# Patient Record
Sex: Female | Born: 1935 | Race: White | Hispanic: No | Smoking: Never smoker
Health system: Southern US, Community
[De-identification: ages and names within clinical notes are randomized; demographics above are authoritative.]

## PROBLEM LIST (undated history)

## (undated) DIAGNOSIS — I10 Essential (primary) hypertension: Secondary | ICD-10-CM

## (undated) DIAGNOSIS — C541 Malignant neoplasm of endometrium: Secondary | ICD-10-CM

## (undated) DIAGNOSIS — I4891 Unspecified atrial fibrillation: Secondary | ICD-10-CM

## (undated) DIAGNOSIS — I499 Cardiac arrhythmia, unspecified: Secondary | ICD-10-CM

## (undated) DIAGNOSIS — C189 Malignant neoplasm of colon, unspecified: Secondary | ICD-10-CM

## (undated) DIAGNOSIS — C801 Malignant (primary) neoplasm, unspecified: Secondary | ICD-10-CM

## (undated) HISTORY — PX: OTHER SURGICAL HISTORY: SHX169

## (undated) HISTORY — DX: Cardiac arrhythmia, unspecified: I49.9

## (undated) HISTORY — PX: COLECTOMY: SHX59

## (undated) HISTORY — DX: Malignant neoplasm of colon, unspecified: C18.9

## (undated) HISTORY — PX: BOWEL RESECTION: SHX1257

## (undated) HISTORY — DX: Malignant (primary) neoplasm, unspecified: C80.1

## (undated) HISTORY — DX: Essential (primary) hypertension: I10

## (undated) HISTORY — PX: APPENDECTOMY: SHX54

---

## 1998-02-25 ENCOUNTER — Other Ambulatory Visit: Admission: RE | Admit: 1998-02-25 | Discharge: 1998-02-25 | Payer: Self-pay | Admitting: Family Medicine

## 1999-05-11 ENCOUNTER — Other Ambulatory Visit: Admission: RE | Admit: 1999-05-11 | Discharge: 1999-05-11 | Payer: Self-pay | Admitting: Family Medicine

## 1999-10-19 ENCOUNTER — Encounter: Payer: Self-pay | Admitting: Gastroenterology

## 1999-10-19 ENCOUNTER — Ambulatory Visit (HOSPITAL_COMMUNITY): Admission: RE | Admit: 1999-10-19 | Discharge: 1999-10-19 | Payer: Self-pay | Admitting: Gastroenterology

## 2000-01-18 ENCOUNTER — Ambulatory Visit (HOSPITAL_COMMUNITY): Admission: RE | Admit: 2000-01-18 | Discharge: 2000-01-18 | Payer: Self-pay | Admitting: Gastroenterology

## 2000-01-18 ENCOUNTER — Encounter (INDEPENDENT_AMBULATORY_CARE_PROVIDER_SITE_OTHER): Payer: Self-pay

## 2000-01-26 ENCOUNTER — Encounter: Payer: Self-pay | Admitting: General Surgery

## 2000-01-28 ENCOUNTER — Encounter (INDEPENDENT_AMBULATORY_CARE_PROVIDER_SITE_OTHER): Payer: Self-pay | Admitting: Specialist

## 2000-01-28 ENCOUNTER — Inpatient Hospital Stay (HOSPITAL_COMMUNITY): Admission: RE | Admit: 2000-01-28 | Discharge: 2000-02-05 | Payer: Self-pay | Admitting: General Surgery

## 2000-04-04 ENCOUNTER — Encounter: Payer: Self-pay | Admitting: General Surgery

## 2000-04-04 ENCOUNTER — Ambulatory Visit (HOSPITAL_BASED_OUTPATIENT_CLINIC_OR_DEPARTMENT_OTHER): Admission: RE | Admit: 2000-04-04 | Discharge: 2000-04-04 | Payer: Self-pay | Admitting: General Surgery

## 2000-04-05 ENCOUNTER — Encounter: Payer: Self-pay | Admitting: Oncology

## 2000-04-05 ENCOUNTER — Encounter: Admission: RE | Admit: 2000-04-05 | Discharge: 2000-04-05 | Payer: Self-pay | Admitting: Oncology

## 2000-04-07 ENCOUNTER — Encounter: Admission: RE | Admit: 2000-04-07 | Discharge: 2000-07-06 | Payer: Self-pay | Admitting: *Deleted

## 2000-04-10 ENCOUNTER — Ambulatory Visit (HOSPITAL_COMMUNITY): Admission: RE | Admit: 2000-04-10 | Discharge: 2000-04-10 | Payer: Self-pay | Admitting: Oncology

## 2000-04-11 ENCOUNTER — Encounter: Payer: Self-pay | Admitting: Oncology

## 2000-04-11 ENCOUNTER — Ambulatory Visit (HOSPITAL_COMMUNITY): Admission: RE | Admit: 2000-04-11 | Discharge: 2000-04-11 | Payer: Self-pay | Admitting: Oncology

## 2000-05-11 ENCOUNTER — Other Ambulatory Visit: Admission: RE | Admit: 2000-05-11 | Discharge: 2000-05-11 | Payer: Self-pay | Admitting: *Deleted

## 2000-05-17 ENCOUNTER — Encounter (INDEPENDENT_AMBULATORY_CARE_PROVIDER_SITE_OTHER): Payer: Self-pay | Admitting: Specialist

## 2000-05-17 ENCOUNTER — Inpatient Hospital Stay (HOSPITAL_COMMUNITY): Admission: AD | Admit: 2000-05-17 | Discharge: 2000-06-27 | Payer: Self-pay | Admitting: Oncology

## 2000-05-17 ENCOUNTER — Encounter: Payer: Self-pay | Admitting: Oncology

## 2000-05-19 ENCOUNTER — Encounter: Payer: Self-pay | Admitting: Oncology

## 2000-05-20 ENCOUNTER — Encounter: Payer: Self-pay | Admitting: Oncology

## 2000-05-21 ENCOUNTER — Encounter: Payer: Self-pay | Admitting: Oncology

## 2000-05-22 ENCOUNTER — Encounter: Payer: Self-pay | Admitting: Surgery

## 2000-05-22 ENCOUNTER — Encounter: Payer: Self-pay | Admitting: Oncology

## 2000-05-30 ENCOUNTER — Encounter: Payer: Self-pay | Admitting: Oncology

## 2000-06-01 ENCOUNTER — Encounter: Payer: Self-pay | Admitting: Gastroenterology

## 2000-06-13 ENCOUNTER — Encounter: Payer: Self-pay | Admitting: Surgery

## 2000-06-15 ENCOUNTER — Encounter: Payer: Self-pay | Admitting: Surgery

## 2000-06-16 ENCOUNTER — Encounter: Payer: Self-pay | Admitting: Oncology

## 2000-06-17 ENCOUNTER — Encounter: Payer: Self-pay | Admitting: Surgery

## 2000-06-20 ENCOUNTER — Encounter (HOSPITAL_COMMUNITY): Payer: Self-pay | Admitting: Oncology

## 2000-10-06 ENCOUNTER — Encounter: Payer: Self-pay | Admitting: Oncology

## 2000-10-06 ENCOUNTER — Ambulatory Visit (HOSPITAL_COMMUNITY): Admission: RE | Admit: 2000-10-06 | Discharge: 2000-10-06 | Payer: Self-pay | Admitting: Oncology

## 2000-12-31 ENCOUNTER — Ambulatory Visit (HOSPITAL_COMMUNITY): Admission: RE | Admit: 2000-12-31 | Discharge: 2000-12-31 | Payer: Self-pay | Admitting: Family Medicine

## 2000-12-31 ENCOUNTER — Encounter: Payer: Self-pay | Admitting: Family Medicine

## 2001-02-07 ENCOUNTER — Ambulatory Visit (HOSPITAL_COMMUNITY): Admission: RE | Admit: 2001-02-07 | Discharge: 2001-02-07 | Payer: Self-pay | Admitting: Gastroenterology

## 2001-02-07 ENCOUNTER — Encounter (INDEPENDENT_AMBULATORY_CARE_PROVIDER_SITE_OTHER): Payer: Self-pay

## 2001-09-27 ENCOUNTER — Encounter: Payer: Self-pay | Admitting: Oncology

## 2001-09-27 ENCOUNTER — Ambulatory Visit (HOSPITAL_COMMUNITY): Admission: RE | Admit: 2001-09-27 | Discharge: 2001-09-27 | Payer: Self-pay | Admitting: Oncology

## 2002-01-24 ENCOUNTER — Encounter: Payer: Self-pay | Admitting: Oncology

## 2002-01-24 ENCOUNTER — Ambulatory Visit (HOSPITAL_COMMUNITY): Admission: RE | Admit: 2002-01-24 | Discharge: 2002-01-24 | Payer: Self-pay | Admitting: Oncology

## 2002-07-11 ENCOUNTER — Other Ambulatory Visit: Admission: RE | Admit: 2002-07-11 | Discharge: 2002-07-11 | Payer: Self-pay | Admitting: Family Medicine

## 2002-08-03 ENCOUNTER — Encounter: Payer: Self-pay | Admitting: Oncology

## 2002-08-03 ENCOUNTER — Ambulatory Visit (HOSPITAL_COMMUNITY): Admission: RE | Admit: 2002-08-03 | Discharge: 2002-08-03 | Payer: Self-pay | Admitting: Oncology

## 2003-01-31 ENCOUNTER — Ambulatory Visit (HOSPITAL_COMMUNITY): Admission: RE | Admit: 2003-01-31 | Discharge: 2003-01-31 | Payer: Self-pay | Admitting: Oncology

## 2003-01-31 ENCOUNTER — Encounter: Payer: Self-pay | Admitting: Oncology

## 2003-08-01 ENCOUNTER — Ambulatory Visit (HOSPITAL_COMMUNITY): Admission: RE | Admit: 2003-08-01 | Discharge: 2003-08-01 | Payer: Self-pay | Admitting: Oncology

## 2004-01-28 ENCOUNTER — Ambulatory Visit (HOSPITAL_COMMUNITY): Admission: RE | Admit: 2004-01-28 | Discharge: 2004-01-28 | Payer: Self-pay | Admitting: Oncology

## 2004-02-28 ENCOUNTER — Ambulatory Visit (HOSPITAL_COMMUNITY): Admission: RE | Admit: 2004-02-28 | Discharge: 2004-02-28 | Payer: Self-pay | Admitting: Gastroenterology

## 2004-02-28 ENCOUNTER — Encounter (INDEPENDENT_AMBULATORY_CARE_PROVIDER_SITE_OTHER): Payer: Self-pay | Admitting: Specialist

## 2004-07-29 ENCOUNTER — Ambulatory Visit: Payer: Self-pay | Admitting: Oncology

## 2004-08-03 ENCOUNTER — Ambulatory Visit (HOSPITAL_COMMUNITY): Admission: RE | Admit: 2004-08-03 | Discharge: 2004-08-03 | Payer: Self-pay | Admitting: Oncology

## 2005-01-18 ENCOUNTER — Ambulatory Visit: Payer: Self-pay | Admitting: Oncology

## 2005-01-29 ENCOUNTER — Ambulatory Visit (HOSPITAL_COMMUNITY): Admission: RE | Admit: 2005-01-29 | Discharge: 2005-01-29 | Payer: Self-pay | Admitting: Oncology

## 2005-02-04 ENCOUNTER — Other Ambulatory Visit: Admission: RE | Admit: 2005-02-04 | Discharge: 2005-02-04 | Payer: Self-pay | Admitting: Family Medicine

## 2005-04-27 ENCOUNTER — Ambulatory Visit (HOSPITAL_COMMUNITY): Admission: RE | Admit: 2005-04-27 | Discharge: 2005-04-27 | Payer: Self-pay | Admitting: Oncology

## 2006-01-19 ENCOUNTER — Encounter: Payer: Self-pay | Admitting: Oncology

## 2006-01-26 ENCOUNTER — Ambulatory Visit: Payer: Self-pay | Admitting: Oncology

## 2006-01-28 LAB — CBC WITH DIFFERENTIAL/PLATELET
BASO%: 0.3 % (ref 0.0–2.0)
EOS%: 2.8 % (ref 0.0–7.0)
MCH: 30.9 pg (ref 26.0–34.0)
MCHC: 33.8 g/dL (ref 32.0–36.0)
NEUT%: 75.1 % (ref 39.6–76.8)
RBC: 4.31 10*6/uL (ref 3.70–5.32)
RDW: 13.7 % (ref 11.3–14.5)
lymph#: 0.6 10*3/uL — ABNORMAL LOW (ref 0.9–3.3)

## 2006-01-28 LAB — COMPREHENSIVE METABOLIC PANEL
ALT: 16 U/L (ref 0–40)
Alkaline Phosphatase: 50 U/L (ref 39–117)
CO2: 28 mEq/L (ref 19–32)
Creatinine, Ser: 1 mg/dL (ref 0.4–1.2)
Sodium: 141 mEq/L (ref 135–145)
Total Bilirubin: 0.6 mg/dL (ref 0.3–1.2)
Total Protein: 6.4 g/dL (ref 6.0–8.3)

## 2006-01-28 LAB — CEA: CEA: 4.7 ng/mL (ref 0.0–5.0)

## 2006-01-28 LAB — LACTATE DEHYDROGENASE: LDH: 157 U/L (ref 94–250)

## 2006-02-03 ENCOUNTER — Ambulatory Visit (HOSPITAL_COMMUNITY): Admission: RE | Admit: 2006-02-03 | Discharge: 2006-02-03 | Payer: Self-pay | Admitting: Oncology

## 2006-02-03 LAB — FECAL OCCULT BLOOD, GUAIAC: Occult Blood: NEGATIVE

## 2006-11-14 ENCOUNTER — Inpatient Hospital Stay (HOSPITAL_COMMUNITY): Admission: EM | Admit: 2006-11-14 | Discharge: 2006-11-17 | Payer: Self-pay | Admitting: *Deleted

## 2006-11-14 ENCOUNTER — Ambulatory Visit: Payer: Self-pay | Admitting: Family Medicine

## 2006-11-15 ENCOUNTER — Ambulatory Visit: Payer: Self-pay | Admitting: Vascular Surgery

## 2006-11-15 ENCOUNTER — Encounter (INDEPENDENT_AMBULATORY_CARE_PROVIDER_SITE_OTHER): Payer: Self-pay | Admitting: Cardiology

## 2006-11-15 ENCOUNTER — Encounter: Payer: Self-pay | Admitting: Vascular Surgery

## 2007-01-25 ENCOUNTER — Ambulatory Visit: Payer: Self-pay | Admitting: Oncology

## 2007-01-27 LAB — COMPREHENSIVE METABOLIC PANEL
ALT: 17 U/L (ref 0–35)
Albumin: 3.9 g/dL (ref 3.5–5.2)
BUN: 25 mg/dL — ABNORMAL HIGH (ref 6–23)
CO2: 32 mEq/L (ref 19–32)
Calcium: 9.9 mg/dL (ref 8.4–10.5)
Chloride: 103 mEq/L (ref 96–112)
Creatinine, Ser: 1.24 mg/dL — ABNORMAL HIGH (ref 0.40–1.20)
Potassium: 3.6 mEq/L (ref 3.5–5.3)

## 2007-01-27 LAB — CBC WITH DIFFERENTIAL/PLATELET
BASO%: 0.6 % (ref 0.0–2.0)
Basophils Absolute: 0 10*3/uL (ref 0.0–0.1)
HCT: 37.2 % (ref 34.8–46.6)
HGB: 12.9 g/dL (ref 11.6–15.9)
MONO#: 0.3 10*3/uL (ref 0.1–0.9)
NEUT%: 75 % (ref 39.6–76.8)
WBC: 3.5 10*3/uL — ABNORMAL LOW (ref 3.9–10.0)
lymph#: 0.5 10*3/uL — ABNORMAL LOW (ref 0.9–3.3)

## 2007-01-27 LAB — LACTATE DEHYDROGENASE: LDH: 153 U/L (ref 94–250)

## 2007-01-27 LAB — FECAL OCCULT BLOOD, GUAIAC: Occult Blood: NEGATIVE

## 2007-01-30 ENCOUNTER — Ambulatory Visit (HOSPITAL_COMMUNITY): Admission: RE | Admit: 2007-01-30 | Discharge: 2007-01-30 | Payer: Self-pay | Admitting: Oncology

## 2007-10-10 ENCOUNTER — Encounter: Admission: RE | Admit: 2007-10-10 | Discharge: 2007-10-10 | Payer: Self-pay | Admitting: Family Medicine

## 2007-10-16 ENCOUNTER — Inpatient Hospital Stay (HOSPITAL_COMMUNITY): Admission: RE | Admit: 2007-10-16 | Discharge: 2007-10-19 | Payer: Self-pay | Admitting: Neurological Surgery

## 2008-01-22 ENCOUNTER — Ambulatory Visit: Payer: Self-pay | Admitting: Oncology

## 2008-01-24 LAB — LACTATE DEHYDROGENASE: LDH: 168 U/L (ref 94–250)

## 2008-01-24 LAB — CEA: CEA: 4.9 ng/mL (ref 0.0–5.0)

## 2008-01-24 LAB — CBC WITH DIFFERENTIAL/PLATELET
Basophils Absolute: 0 10*3/uL (ref 0.0–0.1)
Eosinophils Absolute: 0.1 10*3/uL (ref 0.0–0.5)
HGB: 13.8 g/dL (ref 11.6–15.9)
LYMPH%: 14.8 % (ref 14.0–48.0)
MONO#: 0.4 10*3/uL (ref 0.1–0.9)
NEUT#: 3.4 10*3/uL (ref 1.5–6.5)
Platelets: 139 10*3/uL — ABNORMAL LOW (ref 145–400)
RBC: 4.59 10*6/uL (ref 3.70–5.32)
RDW: 12.6 % (ref 11.3–14.5)
WBC: 4.6 10*3/uL (ref 3.9–10.0)

## 2008-01-24 LAB — COMPREHENSIVE METABOLIC PANEL
Albumin: 4.3 g/dL (ref 3.5–5.2)
BUN: 29 mg/dL — ABNORMAL HIGH (ref 6–23)
CO2: 30 mEq/L (ref 19–32)
Calcium: 10.4 mg/dL (ref 8.4–10.5)
Glucose, Bld: 92 mg/dL (ref 70–99)
Potassium: 4.1 mEq/L (ref 3.5–5.3)
Sodium: 143 mEq/L (ref 135–145)
Total Protein: 7.4 g/dL (ref 6.0–8.3)

## 2009-12-12 ENCOUNTER — Encounter: Admission: RE | Admit: 2009-12-12 | Discharge: 2009-12-12 | Payer: Self-pay | Admitting: Family Medicine

## 2010-09-21 ENCOUNTER — Encounter (INDEPENDENT_AMBULATORY_CARE_PROVIDER_SITE_OTHER): Payer: Self-pay | Admitting: Pulmonary Disease

## 2010-09-21 ENCOUNTER — Inpatient Hospital Stay (HOSPITAL_COMMUNITY)
Admission: EM | Admit: 2010-09-21 | Discharge: 2010-10-23 | DRG: 853 | Disposition: A | Discharge status: Other Acute Inpatient Hospital | Payer: Medicare Other | Attending: Emergency Medicine | Admitting: Emergency Medicine

## 2010-09-21 DIAGNOSIS — K929 Disease of digestive system, unspecified: Secondary | ICD-10-CM | POA: Diagnosis not present

## 2010-09-21 DIAGNOSIS — R197 Diarrhea, unspecified: Secondary | ICD-10-CM | POA: Diagnosis not present

## 2010-09-21 DIAGNOSIS — R5381 Other malaise: Secondary | ICD-10-CM | POA: Diagnosis present

## 2010-09-21 DIAGNOSIS — E46 Unspecified protein-calorie malnutrition: Secondary | ICD-10-CM | POA: Diagnosis present

## 2010-09-21 DIAGNOSIS — E860 Dehydration: Secondary | ICD-10-CM | POA: Diagnosis present

## 2010-09-21 DIAGNOSIS — K7200 Acute and subacute hepatic failure without coma: Secondary | ICD-10-CM | POA: Diagnosis present

## 2010-09-21 DIAGNOSIS — E2749 Other adrenocortical insufficiency: Secondary | ICD-10-CM | POA: Diagnosis present

## 2010-09-21 DIAGNOSIS — Z9049 Acquired absence of other specified parts of digestive tract: Secondary | ICD-10-CM

## 2010-09-21 DIAGNOSIS — K55059 Acute (reversible) ischemia of intestine, part and extent unspecified: Secondary | ICD-10-CM | POA: Diagnosis present

## 2010-09-21 DIAGNOSIS — E872 Acidosis, unspecified: Secondary | ICD-10-CM | POA: Diagnosis not present

## 2010-09-21 DIAGNOSIS — J90 Pleural effusion, not elsewhere classified: Secondary | ICD-10-CM | POA: Diagnosis not present

## 2010-09-21 DIAGNOSIS — E876 Hypokalemia: Secondary | ICD-10-CM | POA: Diagnosis present

## 2010-09-21 DIAGNOSIS — I4891 Unspecified atrial fibrillation: Secondary | ICD-10-CM | POA: Diagnosis not present

## 2010-09-21 DIAGNOSIS — K5669 Other intestinal obstruction: Secondary | ICD-10-CM | POA: Diagnosis present

## 2010-09-21 DIAGNOSIS — R7309 Other abnormal glucose: Secondary | ICD-10-CM | POA: Diagnosis not present

## 2010-09-21 DIAGNOSIS — D62 Acute posthemorrhagic anemia: Secondary | ICD-10-CM | POA: Diagnosis not present

## 2010-09-21 DIAGNOSIS — R112 Nausea with vomiting, unspecified: Secondary | ICD-10-CM | POA: Diagnosis not present

## 2010-09-21 DIAGNOSIS — N390 Urinary tract infection, site not specified: Secondary | ICD-10-CM | POA: Diagnosis present

## 2010-09-21 DIAGNOSIS — E873 Alkalosis: Secondary | ICD-10-CM | POA: Diagnosis present

## 2010-09-21 DIAGNOSIS — Y836 Removal of other organ (partial) (total) as the cause of abnormal reaction of the patient, or of later complication, without mention of misadventure at the time of the procedure: Secondary | ICD-10-CM | POA: Diagnosis not present

## 2010-09-21 DIAGNOSIS — N179 Acute kidney failure, unspecified: Secondary | ICD-10-CM | POA: Diagnosis present

## 2010-09-21 DIAGNOSIS — I214 Non-ST elevation (NSTEMI) myocardial infarction: Secondary | ICD-10-CM | POA: Diagnosis not present

## 2010-09-21 DIAGNOSIS — R799 Abnormal finding of blood chemistry, unspecified: Secondary | ICD-10-CM | POA: Diagnosis present

## 2010-09-21 DIAGNOSIS — J9819 Other pulmonary collapse: Secondary | ICD-10-CM | POA: Diagnosis not present

## 2010-09-21 DIAGNOSIS — I10 Essential (primary) hypertension: Secondary | ICD-10-CM | POA: Diagnosis present

## 2010-09-21 DIAGNOSIS — A419 Sepsis, unspecified organism: Principal | ICD-10-CM | POA: Diagnosis present

## 2010-09-21 DIAGNOSIS — Z85038 Personal history of other malignant neoplasm of large intestine: Secondary | ICD-10-CM

## 2010-09-21 DIAGNOSIS — K560 Paralytic ileus: Secondary | ICD-10-CM | POA: Diagnosis not present

## 2010-09-21 DIAGNOSIS — E869 Volume depletion, unspecified: Secondary | ICD-10-CM | POA: Diagnosis present

## 2010-09-21 DIAGNOSIS — K912 Postsurgical malabsorption, not elsewhere classified: Secondary | ICD-10-CM | POA: Diagnosis present

## 2010-09-23 LAB — BASIC METABOLIC PANEL
BUN: 58 mg/dL — ABNORMAL HIGH (ref 6–23)
CO2: 23 mEq/L (ref 19–32)
Calcium: 7.6 mg/dL — ABNORMAL LOW (ref 8.4–10.5)
Chloride: 125 mEq/L — ABNORMAL HIGH (ref 96–112)
Creatinine, Ser: 2.37 mg/dL — ABNORMAL HIGH (ref 0.4–1.2)
GFR calc Af Amer: 24 mL/min — ABNORMAL LOW (ref >60)
GFR calc non Af Amer: 20 mL/min — ABNORMAL LOW (ref >60)
Glucose, Bld: 136 mg/dL — ABNORMAL HIGH (ref 70–99)
Potassium: 3.4 mEq/L — ABNORMAL LOW (ref 3.5–5.1)
Sodium: 154 mEq/L — ABNORMAL HIGH (ref 135–145)

## 2010-09-23 LAB — GLUCOSE, CAPILLARY
Glucose-Capillary: 129 mg/dL — ABNORMAL HIGH (ref 70–99)
Glucose-Capillary: 130 mg/dL — ABNORMAL HIGH (ref 70–99)
Glucose-Capillary: 102 mg/dL — ABNORMAL HIGH (ref 70–99)
Glucose-Capillary: 72 mg/dL (ref 70–99)
Glucose-Capillary: 93 mg/dL (ref 70–99)
Glucose-Capillary: 99 mg/dL (ref 70–99)

## 2010-09-23 LAB — CBC
HCT: 34 % — ABNORMAL LOW (ref 36.0–46.0)
Hemoglobin: 10.8 g/dL — ABNORMAL LOW (ref 12.0–15.0)
MCH: 31.3 pg (ref 26.0–34.0)
MCHC: 31.8 g/dL (ref 30.0–36.0)
MCV: 98.6 fL (ref 78.0–100.0)
Platelets: 112 10*3/uL — ABNORMAL LOW (ref 150–400)
RBC: 3.45 MIL/uL — ABNORMAL LOW (ref 3.87–5.11)
RDW: 13.8 % (ref 11.5–15.5)
WBC: 11.6 10*3/uL — ABNORMAL HIGH (ref 4.0–10.5)

## 2010-09-23 LAB — LACTIC ACID, PLASMA: Lactic Acid, Venous: 1 mmol/L (ref 0.5–2.2)

## 2010-09-23 LAB — MAGNESIUM: Magnesium: 1.7 mg/dL (ref 1.5–2.5)

## 2010-09-23 LAB — PHOSPHORUS: Phosphorus: 3.3 mg/dL (ref 2.3–4.6)

## 2010-09-23 LAB — COMPREHENSIVE METABOLIC PANEL
ALT: 602 U/L — ABNORMAL HIGH (ref 0–35)
AST: 236 U/L — ABNORMAL HIGH (ref 0–37)
Albumin: 1.6 g/dL — ABNORMAL LOW (ref 3.5–5.2)
Alkaline Phosphatase: 63 U/L (ref 39–117)
BUN: 62 mg/dL — ABNORMAL HIGH (ref 6–23)
CO2: 22 mEq/L (ref 19–32)
Calcium: 7 mg/dL — ABNORMAL LOW (ref 8.4–10.5)
Chloride: 122 mEq/L — ABNORMAL HIGH (ref 96–112)
Creatinine, Ser: 2.72 mg/dL — ABNORMAL HIGH (ref 0.4–1.2)
GFR calc Af Amer: 20 mL/min — ABNORMAL LOW (ref >60)
GFR calc non Af Amer: 17 mL/min — ABNORMAL LOW (ref >60)
Glucose, Bld: 144 mg/dL — ABNORMAL HIGH (ref 70–99)
Potassium: 3.7 mEq/L (ref 3.5–5.1)
Sodium: 150 mEq/L — ABNORMAL HIGH (ref 135–145)
Total Bilirubin: 0.9 mg/dL (ref 0.3–1.2)
Total Protein: 3.7 g/dL — ABNORMAL LOW (ref 6.0–8.3)

## 2010-09-23 LAB — CARDIAC PANEL(CRET KIN+CKTOT+MB+TROPI)
CK, MB: 5.8 ng/mL — ABNORMAL HIGH (ref 0.3–4.0)
Relative Index: 1.8 (ref 0.0–2.5)
Total CK: 331 U/L — ABNORMAL HIGH (ref 7–177)
Troponin I: 0.33 ng/mL — ABNORMAL HIGH (ref 0.00–0.06)

## 2010-09-24 LAB — GLUCOSE, CAPILLARY
Glucose-Capillary: 137 mg/dL — ABNORMAL HIGH (ref 70–99)
Glucose-Capillary: 141 mg/dL — ABNORMAL HIGH (ref 70–99)
Glucose-Capillary: 148 mg/dL — ABNORMAL HIGH (ref 70–99)
Glucose-Capillary: 106 mg/dL — ABNORMAL HIGH (ref 70–99)
Glucose-Capillary: 156 mg/dL — ABNORMAL HIGH (ref 70–99)

## 2010-09-24 LAB — CBC
HCT: 32 % — ABNORMAL LOW (ref 36.0–46.0)
Hemoglobin: 10.4 g/dL — ABNORMAL LOW (ref 12.0–15.0)
MCH: 31 pg (ref 26.0–34.0)
MCHC: 32.5 g/dL (ref 30.0–36.0)
MCV: 95.5 fL (ref 78.0–100.0)
Platelets: 90 10*3/uL — ABNORMAL LOW (ref 150–400)
RBC: 3.35 MIL/uL — ABNORMAL LOW (ref 3.87–5.11)
RDW: 13.7 % (ref 11.5–15.5)
WBC: 11.9 10*3/uL — ABNORMAL HIGH (ref 4.0–10.5)

## 2010-09-24 LAB — COMPREHENSIVE METABOLIC PANEL
ALT: 390 U/L — ABNORMAL HIGH (ref 0–35)
AST: 96 U/L — ABNORMAL HIGH (ref 0–37)
Albumin: 1.5 g/dL — ABNORMAL LOW (ref 3.5–5.2)
Alkaline Phosphatase: 60 U/L (ref 39–117)
BUN: 54 mg/dL — ABNORMAL HIGH (ref 6–23)
CO2: 24 mEq/L (ref 19–32)
Calcium: 7.5 mg/dL — ABNORMAL LOW (ref 8.4–10.5)
Chloride: 125 mEq/L — ABNORMAL HIGH (ref 96–112)
Creatinine, Ser: 1.97 mg/dL — ABNORMAL HIGH (ref 0.4–1.2)
GFR calc Af Amer: 29 mL/min — ABNORMAL LOW (ref >60)
GFR calc non Af Amer: 24 mL/min — ABNORMAL LOW (ref >60)
Glucose, Bld: 160 mg/dL — ABNORMAL HIGH (ref 70–99)
Potassium: 3.5 mEq/L (ref 3.5–5.1)
Sodium: 151 mEq/L — ABNORMAL HIGH (ref 135–145)
Total Bilirubin: 0.6 mg/dL (ref 0.3–1.2)
Total Protein: 4 g/dL — ABNORMAL LOW (ref 6.0–8.3)

## 2010-09-25 ENCOUNTER — Encounter: Payer: Self-pay | Admitting: Internal Medicine

## 2010-09-25 LAB — CBC
HCT: 32.5 % — ABNORMAL LOW (ref 36.0–46.0)
Hemoglobin: 10.6 g/dL — ABNORMAL LOW (ref 12.0–15.0)
MCH: 31 pg (ref 26.0–34.0)
MCHC: 32.6 g/dL (ref 30.0–36.0)
MCV: 95 fL (ref 78.0–100.0)
Platelets: 101 10*3/uL — ABNORMAL LOW (ref 150–400)
RBC: 3.42 MIL/uL — ABNORMAL LOW (ref 3.87–5.11)
RDW: 13.8 % (ref 11.5–15.5)
WBC: 10.1 10*3/uL (ref 4.0–10.5)

## 2010-09-25 LAB — COMPREHENSIVE METABOLIC PANEL
ALT: 295 U/L — ABNORMAL HIGH (ref 0–35)
AST: 55 U/L — ABNORMAL HIGH (ref 0–37)
Albumin: 1.7 g/dL — ABNORMAL LOW (ref 3.5–5.2)
Alkaline Phosphatase: 68 U/L (ref 39–117)
BUN: 50 mg/dL — ABNORMAL HIGH (ref 6–23)
CO2: 23 mEq/L (ref 19–32)
Calcium: 8 mg/dL — ABNORMAL LOW (ref 8.4–10.5)
Chloride: 121 mEq/L — ABNORMAL HIGH (ref 96–112)
Creatinine, Ser: 1.56 mg/dL — ABNORMAL HIGH (ref 0.4–1.2)
GFR calc Af Amer: 38 mL/min — ABNORMAL LOW (ref >60)
GFR calc non Af Amer: 32 mL/min — ABNORMAL LOW (ref >60)
Glucose, Bld: 102 mg/dL — ABNORMAL HIGH (ref 70–99)
Potassium: 3.8 mEq/L (ref 3.5–5.1)
Sodium: 149 mEq/L — ABNORMAL HIGH (ref 135–145)
Total Bilirubin: 1.3 mg/dL — ABNORMAL HIGH (ref 0.3–1.2)
Total Protein: 4.2 g/dL — ABNORMAL LOW (ref 6.0–8.3)

## 2010-09-25 LAB — GLUCOSE, CAPILLARY
Glucose-Capillary: 72 mg/dL (ref 70–99)
Glucose-Capillary: 85 mg/dL (ref 70–99)
Glucose-Capillary: 93 mg/dL (ref 70–99)
Glucose-Capillary: 94 mg/dL (ref 70–99)
Glucose-Capillary: 99 mg/dL (ref 70–99)

## 2010-10-05 LAB — CBC
HCT: 31.9 % — ABNORMAL LOW (ref 36.0–46.0)
HCT: 31.1 % — ABNORMAL LOW (ref 36.0–46.0)
HCT: 25.8 % — ABNORMAL LOW (ref 36.0–46.0)
HCT: 23.8 % — ABNORMAL LOW (ref 36.0–46.0)
Hemoglobin: 10.5 g/dL — ABNORMAL LOW (ref 12.0–15.0)
Hemoglobin: 10.5 g/dL — ABNORMAL LOW (ref 12.0–15.0)
Hemoglobin: 8.5 g/dL — ABNORMAL LOW (ref 12.0–15.0)
Hemoglobin: 7.9 g/dL — ABNORMAL LOW (ref 12.0–15.0)
MCH: 30.3 pg (ref 26.0–34.0)
MCH: 31.3 pg (ref 26.0–34.0)
MCH: 30.4 pg (ref 26.0–34.0)
MCH: 30.9 pg (ref 26.0–34.0)
MCHC: 32.9 g/dL (ref 30.0–36.0)
MCHC: 33.8 g/dL (ref 30.0–36.0)
MCHC: 32.9 g/dL (ref 30.0–36.0)
MCHC: 33.2 g/dL (ref 30.0–36.0)
MCV: 92.2 fL (ref 78.0–100.0)
MCV: 92.6 fL (ref 78.0–100.0)
MCV: 92.1 fL (ref 78.0–100.0)
MCV: 93 fL (ref 78.0–100.0)
Platelets: 72 10*3/uL — ABNORMAL LOW (ref 150–400)
Platelets: 177 10*3/uL (ref 150–400)
Platelets: 164 10*3/uL (ref 150–400)
Platelets: 202 10*3/uL (ref 150–400)
RBC: 3.46 MIL/uL — ABNORMAL LOW (ref 3.87–5.11)
RBC: 3.36 MIL/uL — ABNORMAL LOW (ref 3.87–5.11)
RBC: 2.8 MIL/uL — ABNORMAL LOW (ref 3.87–5.11)
RBC: 2.56 MIL/uL — ABNORMAL LOW (ref 3.87–5.11)
RDW: 13.3 % (ref 11.5–15.5)
RDW: 13.6 % (ref 11.5–15.5)
RDW: 13.4 % (ref 11.5–15.5)
RDW: 13.7 % (ref 11.5–15.5)
WBC: 8.4 10*3/uL (ref 4.0–10.5)
WBC: 8.4 10*3/uL (ref 4.0–10.5)
WBC: 2.9 10*3/uL — ABNORMAL LOW (ref 4.0–10.5)
WBC: 3 10*3/uL — ABNORMAL LOW (ref 4.0–10.5)

## 2010-10-05 LAB — BASIC METABOLIC PANEL
BUN: 28 mg/dL — ABNORMAL HIGH (ref 6–23)
BUN: 16 mg/dL (ref 6–23)
BUN: 13 mg/dL (ref 6–23)
BUN: 9 mg/dL (ref 6–23)
BUN: 8 mg/dL (ref 6–23)
BUN: 8 mg/dL (ref 6–23)
BUN: 16 mg/dL (ref 6–23)
BUN: 18 mg/dL (ref 6–23)
BUN: 16 mg/dL (ref 6–23)
CO2: 24 mEq/L (ref 19–32)
CO2: 29 mEq/L (ref 19–32)
CO2: 28 mEq/L (ref 19–32)
CO2: 28 mEq/L (ref 19–32)
CO2: 27 mEq/L (ref 19–32)
CO2: 23 mEq/L (ref 19–32)
CO2: 23 mEq/L (ref 19–32)
CO2: 22 mEq/L (ref 19–32)
CO2: 22 mEq/L (ref 19–32)
Calcium: 7.8 mg/dL — ABNORMAL LOW (ref 8.4–10.5)
Calcium: 7.4 mg/dL — ABNORMAL LOW (ref 8.4–10.5)
Calcium: 7.6 mg/dL — ABNORMAL LOW (ref 8.4–10.5)
Calcium: 7.2 mg/dL — ABNORMAL LOW (ref 8.4–10.5)
Calcium: 7.3 mg/dL — ABNORMAL LOW (ref 8.4–10.5)
Calcium: 7.8 mg/dL — ABNORMAL LOW (ref 8.4–10.5)
Calcium: 7.9 mg/dL — ABNORMAL LOW (ref 8.4–10.5)
Calcium: 7.8 mg/dL — ABNORMAL LOW (ref 8.4–10.5)
Calcium: 7.8 mg/dL — ABNORMAL LOW (ref 8.4–10.5)
Chloride: 112 mEq/L (ref 96–112)
Chloride: 108 mEq/L (ref 96–112)
Chloride: 104 mEq/L (ref 96–112)
Chloride: 104 mEq/L (ref 96–112)
Chloride: 107 mEq/L (ref 96–112)
Chloride: 107 mEq/L (ref 96–112)
Chloride: 105 mEq/L (ref 96–112)
Chloride: 108 mEq/L (ref 96–112)
Chloride: 110 mEq/L (ref 96–112)
Creatinine, Ser: 1.19 mg/dL (ref 0.4–1.2)
Creatinine, Ser: 0.97 mg/dL (ref 0.4–1.2)
Creatinine, Ser: 0.94 mg/dL (ref 0.4–1.2)
Creatinine, Ser: 0.8 mg/dL (ref 0.4–1.2)
Creatinine, Ser: 0.84 mg/dL (ref 0.4–1.2)
Creatinine, Ser: 0.76 mg/dL (ref 0.4–1.2)
Creatinine, Ser: 1.2 mg/dL (ref 0.4–1.2)
Creatinine, Ser: 0.94 mg/dL (ref 0.4–1.2)
Creatinine, Ser: 0.79 mg/dL (ref 0.4–1.2)
GFR calc Af Amer: 52 mL/min — ABNORMAL LOW (ref >60)
GFR calc Af Amer: >60 mL/min (ref >60)
GFR calc Af Amer: >60 mL/min (ref >60)
GFR calc Af Amer: >60 mL/min (ref >60)
GFR calc Af Amer: >60 mL/min (ref >60)
GFR calc Af Amer: >60 mL/min (ref >60)
GFR calc Af Amer: 52 mL/min — ABNORMAL LOW (ref >60)
GFR calc Af Amer: >60 mL/min (ref >60)
GFR calc Af Amer: >60 mL/min (ref >60)
GFR calc non Af Amer: 43 mL/min — ABNORMAL LOW (ref >60)
GFR calc non Af Amer: 55 mL/min — ABNORMAL LOW (ref >60)
GFR calc non Af Amer: 57 mL/min — ABNORMAL LOW (ref >60)
GFR calc non Af Amer: >60 mL/min (ref >60)
GFR calc non Af Amer: >60 mL/min (ref >60)
GFR calc non Af Amer: >60 mL/min (ref >60)
GFR calc non Af Amer: 43 mL/min — ABNORMAL LOW (ref >60)
GFR calc non Af Amer: 57 mL/min — ABNORMAL LOW (ref >60)
GFR calc non Af Amer: >60 mL/min (ref >60)
Glucose, Bld: 129 mg/dL — ABNORMAL HIGH (ref 70–99)
Glucose, Bld: 119 mg/dL — ABNORMAL HIGH (ref 70–99)
Glucose, Bld: 123 mg/dL — ABNORMAL HIGH (ref 70–99)
Glucose, Bld: 102 mg/dL — ABNORMAL HIGH (ref 70–99)
Glucose, Bld: 78 mg/dL (ref 70–99)
Glucose, Bld: 101 mg/dL — ABNORMAL HIGH (ref 70–99)
Glucose, Bld: 114 mg/dL — ABNORMAL HIGH (ref 70–99)
Glucose, Bld: 99 mg/dL (ref 70–99)
Glucose, Bld: 76 mg/dL (ref 70–99)
Potassium: 2.5 mEq/L — CL (ref 3.5–5.1)
Potassium: 2.3 mEq/L — CL (ref 3.5–5.1)
Potassium: 2.8 mEq/L — ABNORMAL LOW (ref 3.5–5.1)
Potassium: 2.8 mEq/L — ABNORMAL LOW (ref 3.5–5.1)
Potassium: 3 mEq/L — ABNORMAL LOW (ref 3.5–5.1)
Potassium: 3.7 mEq/L (ref 3.5–5.1)
Potassium: 4.7 mEq/L (ref 3.5–5.1)
Potassium: 4.2 mEq/L (ref 3.5–5.1)
Potassium: 4.2 mEq/L (ref 3.5–5.1)
Sodium: 145 mEq/L (ref 135–145)
Sodium: 144 mEq/L (ref 135–145)
Sodium: 139 mEq/L (ref 135–145)
Sodium: 138 mEq/L (ref 135–145)
Sodium: 140 mEq/L (ref 135–145)
Sodium: 137 mEq/L (ref 135–145)
Sodium: 132 mEq/L — ABNORMAL LOW (ref 135–145)
Sodium: 135 mEq/L (ref 135–145)
Sodium: 136 mEq/L (ref 135–145)

## 2010-10-05 LAB — URINALYSIS, ROUTINE W REFLEX MICROSCOPIC
Bilirubin Urine: NEGATIVE
Hgb urine dipstick: NEGATIVE
Ketones, ur: NEGATIVE mg/dL
Nitrite: NEGATIVE
Protein, ur: NEGATIVE mg/dL
Specific Gravity, Urine: 1.021 (ref 1.005–1.030)
Urine Glucose, Fasting: 250 mg/dL — AB
Urobilinogen, UA: 0.2 mg/dL (ref 0.0–1.0)
pH: 5.5 (ref 5.0–8.0)

## 2010-10-05 LAB — GLUCOSE, CAPILLARY
Glucose-Capillary: 90 mg/dL (ref 70–99)
Glucose-Capillary: 91 mg/dL (ref 70–99)
Glucose-Capillary: 100 mg/dL — ABNORMAL HIGH (ref 70–99)
Glucose-Capillary: 102 mg/dL — ABNORMAL HIGH (ref 70–99)
Glucose-Capillary: 116 mg/dL — ABNORMAL HIGH (ref 70–99)
Glucose-Capillary: 116 mg/dL — ABNORMAL HIGH (ref 70–99)
Glucose-Capillary: 89 mg/dL (ref 70–99)
Glucose-Capillary: 100 mg/dL — ABNORMAL HIGH (ref 70–99)
Glucose-Capillary: 116 mg/dL — ABNORMAL HIGH (ref 70–99)
Glucose-Capillary: 123 mg/dL — ABNORMAL HIGH (ref 70–99)
Glucose-Capillary: 93 mg/dL (ref 70–99)
Glucose-Capillary: 97 mg/dL (ref 70–99)
Glucose-Capillary: 99 mg/dL (ref 70–99)
Glucose-Capillary: 100 mg/dL — ABNORMAL HIGH (ref 70–99)
Glucose-Capillary: 113 mg/dL — ABNORMAL HIGH (ref 70–99)
Glucose-Capillary: 68 mg/dL — ABNORMAL LOW (ref 70–99)
Glucose-Capillary: 73 mg/dL (ref 70–99)
Glucose-Capillary: 75 mg/dL (ref 70–99)
Glucose-Capillary: 79 mg/dL (ref 70–99)
Glucose-Capillary: 86 mg/dL (ref 70–99)
Glucose-Capillary: 89 mg/dL (ref 70–99)
Glucose-Capillary: 94 mg/dL (ref 70–99)
Glucose-Capillary: 97 mg/dL (ref 70–99)
Glucose-Capillary: 126 mg/dL — ABNORMAL HIGH (ref 70–99)

## 2010-10-05 LAB — PHOSPHORUS
Phosphorus: 2 mg/dL — ABNORMAL LOW (ref 2.3–4.6)
Phosphorus: 3.3 mg/dL (ref 2.3–4.6)

## 2010-10-05 LAB — MAGNESIUM
Magnesium: 1 mg/dL — ABNORMAL LOW (ref 1.5–2.5)
Magnesium: 1.4 mg/dL — ABNORMAL LOW (ref 1.5–2.5)
Magnesium: 1.7 mg/dL (ref 1.5–2.5)

## 2010-10-05 LAB — CLOSTRIDIUM DIFFICILE BY PCR: Toxigenic C. Difficile by PCR: NEGATIVE

## 2010-10-07 LAB — BASIC METABOLIC PANEL
BUN: 16 mg/dL (ref 6–23)
BUN: 19 mg/dL (ref 6–23)
CO2: 23 mEq/L (ref 19–32)
CO2: 18 mEq/L — ABNORMAL LOW (ref 19–32)
Calcium: 7.9 mg/dL — ABNORMAL LOW (ref 8.4–10.5)
Calcium: 8 mg/dL — ABNORMAL LOW (ref 8.4–10.5)
Chloride: 108 mEq/L (ref 96–112)
Chloride: 111 mEq/L (ref 96–112)
Creatinine, Ser: 0.69 mg/dL (ref 0.4–1.2)
Creatinine, Ser: 1.62 mg/dL — ABNORMAL HIGH (ref 0.4–1.2)
GFR calc Af Amer: >60 mL/min (ref >60)
GFR calc Af Amer: 37 mL/min — ABNORMAL LOW (ref >60)
GFR calc non Af Amer: >60 mL/min (ref >60)
GFR calc non Af Amer: 30 mL/min — ABNORMAL LOW (ref >60)
Glucose, Bld: 80 mg/dL (ref 70–99)
Glucose, Bld: 106 mg/dL — ABNORMAL HIGH (ref 70–99)
Potassium: 4.3 mEq/L (ref 3.5–5.1)
Potassium: 4.7 mEq/L (ref 3.5–5.1)
Sodium: 135 mEq/L (ref 135–145)
Sodium: 138 mEq/L (ref 135–145)

## 2010-10-07 LAB — CBC
HCT: 27.5 % — ABNORMAL LOW (ref 36.0–46.0)
HCT: 26.9 % — ABNORMAL LOW (ref 36.0–46.0)
Hemoglobin: 9.2 g/dL — ABNORMAL LOW (ref 12.0–15.0)
Hemoglobin: 9.1 g/dL — ABNORMAL LOW (ref 12.0–15.0)
MCH: 30.7 pg (ref 26.0–34.0)
MCH: 31 pg (ref 26.0–34.0)
MCHC: 33.5 g/dL (ref 30.0–36.0)
MCHC: 33.8 g/dL (ref 30.0–36.0)
MCV: 91.7 fL (ref 78.0–100.0)
MCV: 91.5 fL (ref 78.0–100.0)
Platelets: 190 10*3/uL (ref 150–400)
Platelets: 263 10*3/uL (ref 150–400)
RBC: 3 MIL/uL — ABNORMAL LOW (ref 3.87–5.11)
RBC: 2.94 MIL/uL — ABNORMAL LOW (ref 3.87–5.11)
RDW: 13.7 % (ref 11.5–15.5)
RDW: 14 % (ref 11.5–15.5)
WBC: 3.2 10*3/uL — ABNORMAL LOW (ref 4.0–10.5)
WBC: 13.2 10*3/uL — ABNORMAL HIGH (ref 4.0–10.5)

## 2010-10-07 LAB — PREALBUMIN: Prealbumin: 5.1 mg/dL — ABNORMAL LOW (ref 17.0–34.0)

## 2010-10-07 LAB — MRSA PCR SCREENING: MRSA by PCR: NEGATIVE

## 2010-10-10 ENCOUNTER — Encounter: Payer: Self-pay | Admitting: Oncology

## 2010-10-11 ENCOUNTER — Encounter: Payer: Self-pay | Admitting: Oncology

## 2010-10-12 LAB — CROSSMATCH
ABO/RH(D): O POS
Antibody Screen: NEGATIVE
Unit division: 0
Unit division: 0
Unit division: 0

## 2010-10-12 LAB — GLUCOSE, CAPILLARY
Glucose-Capillary: 141 mg/dL — ABNORMAL HIGH (ref 70–99)
Glucose-Capillary: 149 mg/dL — ABNORMAL HIGH (ref 70–99)
Glucose-Capillary: 129 mg/dL — ABNORMAL HIGH (ref 70–99)

## 2010-10-12 LAB — COMPREHENSIVE METABOLIC PANEL
ALT: 19 U/L (ref 0–35)
AST: 12 U/L (ref 0–37)
AST: 14 U/L (ref 0–37)
Albumin: 1.3 g/dL — ABNORMAL LOW (ref 3.5–5.2)
Albumin: 1.2 g/dL — ABNORMAL LOW (ref 3.5–5.2)
Alkaline Phosphatase: 58 U/L (ref 39–117)
Chloride: 100 mEq/L (ref 96–112)
Creatinine, Ser: 0.74 mg/dL (ref 0.4–1.2)
GFR calc Af Amer: >60 mL/min (ref >60)
GFR calc Af Amer: >60 mL/min (ref >60)
Potassium: 3.5 mEq/L (ref 3.5–5.1)
Sodium: 135 mEq/L (ref 135–145)
Total Bilirubin: 0.6 mg/dL (ref 0.3–1.2)
Total Protein: 3.5 g/dL — ABNORMAL LOW (ref 6.0–8.3)

## 2010-10-12 LAB — CARDIAC PANEL(CRET KIN+CKTOT+MB+TROPI)
Relative Index: INVALID (ref 0.0–2.5)
Total CK: 31 U/L (ref 7–177)

## 2010-10-12 LAB — DIFFERENTIAL
Eosinophils Absolute: 0.1 10*3/uL (ref 0.0–0.7)
Lymphocytes Relative: 5 % — ABNORMAL LOW (ref 12–46)
Monocytes Absolute: 0.3 10*3/uL (ref 0.1–1.0)
Neutrophils Relative %: 90 % — ABNORMAL HIGH (ref 43–77)
WBC Morphology: INCREASED

## 2010-10-12 LAB — BASIC METABOLIC PANEL
CO2: 31 mEq/L (ref 19–32)
CO2: 31 mEq/L (ref 19–32)
Chloride: 97 mEq/L (ref 96–112)
GFR calc Af Amer: >60 mL/min (ref >60)
GFR calc non Af Amer: >60 mL/min (ref >60)
Glucose, Bld: 136 mg/dL — ABNORMAL HIGH (ref 70–99)
Potassium: 3.6 mEq/L (ref 3.5–5.1)
Potassium: 3.5 mEq/L (ref 3.5–5.1)
Sodium: 132 mEq/L — ABNORMAL LOW (ref 135–145)
Sodium: 135 mEq/L (ref 135–145)

## 2010-10-12 LAB — CBC
HCT: 33 % — ABNORMAL LOW (ref 36.0–46.0)
Hemoglobin: 11.4 g/dL — ABNORMAL LOW (ref 12.0–15.0)
MCH: 30.2 pg (ref 26.0–34.0)
Platelets: 187 10*3/uL (ref 150–400)
Platelets: 168 10*3/uL (ref 150–400)
RBC: 3.54 MIL/uL — ABNORMAL LOW (ref 3.87–5.11)
RDW: 13.8 % (ref 11.5–15.5)
WBC: 6.6 10*3/uL (ref 4.0–10.5)
WBC: 8.6 10*3/uL (ref 4.0–10.5)
WBC: 7.1 10*3/uL (ref 4.0–10.5)

## 2010-10-12 LAB — PREALBUMIN: Prealbumin: 3.6 mg/dL — ABNORMAL LOW (ref 17.0–34.0)

## 2010-10-12 LAB — CLOSTRIDIUM DIFFICILE BY PCR: Toxigenic C. Difficile by PCR: NEGATIVE

## 2010-10-12 LAB — PHOSPHORUS: Phosphorus: 3.5 mg/dL (ref 2.3–4.6)

## 2010-10-13 LAB — DIFFERENTIAL
Basophils Relative: 1 % (ref 0–1)
Eosinophils Absolute: 0.1 10*3/uL (ref 0.0–0.7)
Eosinophils Relative: 2 % (ref 0–5)
Lymphocytes Relative: 6 % — ABNORMAL LOW (ref 12–46)
Monocytes Relative: 4 % (ref 3–12)
Neutro Abs: 6.1 10*3/uL (ref 1.7–7.7)

## 2010-10-13 LAB — CBC
HCT: 32.4 % — ABNORMAL LOW (ref 36.0–46.0)
Hemoglobin: 11.1 g/dL — ABNORMAL LOW (ref 12.0–15.0)
MCH: 30.5 pg (ref 26.0–34.0)
MCHC: 34.3 g/dL (ref 30.0–36.0)
MCV: 89 fL (ref 78.0–100.0)

## 2010-10-13 LAB — COMPREHENSIVE METABOLIC PANEL
BUN: 21 mg/dL (ref 6–23)
CO2: 31 mEq/L (ref 19–32)
Calcium: 7.7 mg/dL — ABNORMAL LOW (ref 8.4–10.5)
Creatinine, Ser: 0.53 mg/dL (ref 0.4–1.2)
GFR calc non Af Amer: >60 mL/min (ref >60)
Glucose, Bld: 148 mg/dL — ABNORMAL HIGH (ref 70–99)
Total Bilirubin: 0.4 mg/dL (ref 0.3–1.2)

## 2010-10-13 LAB — GLUCOSE, CAPILLARY
Glucose-Capillary: 160 mg/dL — ABNORMAL HIGH (ref 70–99)
Glucose-Capillary: 149 mg/dL — ABNORMAL HIGH (ref 70–99)
Glucose-Capillary: 138 mg/dL — ABNORMAL HIGH (ref 70–99)
Glucose-Capillary: 141 mg/dL — ABNORMAL HIGH (ref 70–99)

## 2010-10-13 LAB — MAGNESIUM: Magnesium: 1.7 mg/dL (ref 1.5–2.5)

## 2010-10-14 LAB — BASIC METABOLIC PANEL
CO2: 28 mEq/L (ref 19–32)
Calcium: 8 mg/dL — ABNORMAL LOW (ref 8.4–10.5)
Chloride: 105 mEq/L (ref 96–112)
Chloride: 101 mEq/L (ref 96–112)
Creatinine, Ser: 0.56 mg/dL (ref 0.4–1.2)
GFR calc Af Amer: >60 mL/min (ref >60)
GFR calc Af Amer: >60 mL/min (ref >60)
GFR calc non Af Amer: >60 mL/min (ref >60)
Glucose, Bld: 128 mg/dL — ABNORMAL HIGH (ref 70–99)
Potassium: 3.9 mEq/L (ref 3.5–5.1)
Potassium: 3.8 mEq/L (ref 3.5–5.1)
Sodium: 133 mEq/L — ABNORMAL LOW (ref 135–145)

## 2010-10-14 LAB — GLUCOSE, CAPILLARY: Glucose-Capillary: 135 mg/dL — ABNORMAL HIGH (ref 70–99)

## 2010-10-14 LAB — CBC
HCT: 31.5 % — ABNORMAL LOW (ref 36.0–46.0)
Hemoglobin: 10.5 g/dL — ABNORMAL LOW (ref 12.0–15.0)
MCHC: 33.3 g/dL (ref 30.0–36.0)
RBC: 3.51 MIL/uL — ABNORMAL LOW (ref 3.87–5.11)

## 2010-10-15 LAB — COMPREHENSIVE METABOLIC PANEL
ALT: 19 U/L (ref 0–35)
Albumin: 1.6 g/dL — ABNORMAL LOW (ref 3.5–5.2)
BUN: 17 mg/dL (ref 6–23)
Calcium: 8.2 mg/dL — ABNORMAL LOW (ref 8.4–10.5)
Glucose, Bld: 119 mg/dL — ABNORMAL HIGH (ref 70–99)
Sodium: 136 mEq/L (ref 135–145)
Total Protein: 4.7 g/dL — ABNORMAL LOW (ref 6.0–8.3)

## 2010-10-15 LAB — GLUCOSE, CAPILLARY
Glucose-Capillary: 142 mg/dL — ABNORMAL HIGH (ref 70–99)
Glucose-Capillary: 122 mg/dL — ABNORMAL HIGH (ref 70–99)
Glucose-Capillary: 113 mg/dL — ABNORMAL HIGH (ref 70–99)

## 2010-10-15 LAB — CBC
HCT: 31.4 % — ABNORMAL LOW (ref 36.0–46.0)
MCHC: 33.4 g/dL (ref 30.0–36.0)
Platelets: 347 10*3/uL (ref 150–400)
RDW: 14.1 % (ref 11.5–15.5)
WBC: 6.8 10*3/uL (ref 4.0–10.5)

## 2010-10-15 LAB — MAGNESIUM: Magnesium: 1.8 mg/dL (ref 1.5–2.5)

## 2010-10-15 LAB — PHOSPHORUS: Phosphorus: 2.9 mg/dL (ref 2.3–4.6)

## 2010-10-16 LAB — BASIC METABOLIC PANEL
BUN: 18 mg/dL (ref 6–23)
CO2: 28 mEq/L (ref 19–32)
Calcium: 8.2 mg/dL — ABNORMAL LOW (ref 8.4–10.5)
Chloride: 102 mEq/L (ref 96–112)
Creatinine, Ser: 0.66 mg/dL (ref 0.4–1.2)
GFR calc Af Amer: >60 mL/min (ref >60)
GFR calc non Af Amer: >60 mL/min (ref >60)
Glucose, Bld: 119 mg/dL — ABNORMAL HIGH (ref 70–99)
Potassium: 3.6 mEq/L (ref 3.5–5.1)
Sodium: 135 mEq/L (ref 135–145)

## 2010-10-16 LAB — GLUCOSE, CAPILLARY
Glucose-Capillary: 117 mg/dL — ABNORMAL HIGH (ref 70–99)
Glucose-Capillary: 123 mg/dL — ABNORMAL HIGH (ref 70–99)
Glucose-Capillary: 144 mg/dL — ABNORMAL HIGH (ref 70–99)

## 2010-10-17 LAB — GLUCOSE, CAPILLARY
Glucose-Capillary: 127 mg/dL — ABNORMAL HIGH (ref 70–99)
Glucose-Capillary: 127 mg/dL — ABNORMAL HIGH (ref 70–99)
Glucose-Capillary: 151 mg/dL — ABNORMAL HIGH (ref 70–99)

## 2010-10-17 LAB — BASIC METABOLIC PANEL
CO2: 28 mEq/L (ref 19–32)
Calcium: 8.1 mg/dL — ABNORMAL LOW (ref 8.4–10.5)
Chloride: 100 mEq/L (ref 96–112)
GFR calc Af Amer: >60 mL/min (ref >60)
Potassium: 3.6 mEq/L (ref 3.5–5.1)
Sodium: 133 mEq/L — ABNORMAL LOW (ref 135–145)

## 2010-10-17 LAB — PREALBUMIN: Prealbumin: 13 mg/dL — ABNORMAL LOW (ref 17.0–34.0)

## 2010-10-17 LAB — CBC
Hemoglobin: 10.2 g/dL — ABNORMAL LOW (ref 12.0–15.0)
RBC: 3.39 MIL/uL — ABNORMAL LOW (ref 3.87–5.11)
WBC: 5.9 10*3/uL (ref 4.0–10.5)

## 2010-10-18 LAB — GLUCOSE, CAPILLARY
Glucose-Capillary: 126 mg/dL — ABNORMAL HIGH (ref 70–99)
Glucose-Capillary: 133 mg/dL — ABNORMAL HIGH (ref 70–99)

## 2010-10-18 LAB — BASIC METABOLIC PANEL
CO2: 28 mEq/L (ref 19–32)
Calcium: 7.8 mg/dL — ABNORMAL LOW (ref 8.4–10.5)
Glucose, Bld: 133 mg/dL — ABNORMAL HIGH (ref 70–99)
Sodium: 131 mEq/L — ABNORMAL LOW (ref 135–145)

## 2010-10-19 LAB — COMPREHENSIVE METABOLIC PANEL
Albumin: 1.4 g/dL — ABNORMAL LOW (ref 3.5–5.2)
BUN: 20 mg/dL (ref 6–23)
Calcium: 7.8 mg/dL — ABNORMAL LOW (ref 8.4–10.5)
Creatinine, Ser: 0.65 mg/dL (ref 0.4–1.2)
GFR calc Af Amer: >60 mL/min (ref >60)
Total Bilirubin: 0.3 mg/dL (ref 0.3–1.2)
Total Protein: 4.4 g/dL — ABNORMAL LOW (ref 6.0–8.3)

## 2010-10-19 LAB — CBC
MCH: 29.8 pg (ref 26.0–34.0)
MCHC: 33.5 g/dL (ref 30.0–36.0)
MCV: 89 fL (ref 78.0–100.0)
Platelets: 399 10*3/uL (ref 150–400)
RDW: 13.8 % (ref 11.5–15.5)

## 2010-10-19 LAB — DIFFERENTIAL
Basophils Relative: 2 % — ABNORMAL HIGH (ref 0–1)
Eosinophils Absolute: 0 10*3/uL (ref 0.0–0.7)
Eosinophils Relative: 1 % (ref 0–5)
Lymphs Abs: 0.4 10*3/uL — ABNORMAL LOW (ref 0.7–4.0)
Monocytes Absolute: 0.8 10*3/uL (ref 0.1–1.0)

## 2010-10-19 LAB — GLUCOSE, CAPILLARY
Glucose-Capillary: 145 mg/dL — ABNORMAL HIGH (ref 70–99)
Glucose-Capillary: 149 mg/dL — ABNORMAL HIGH (ref 70–99)

## 2010-10-19 LAB — CHOLESTEROL, TOTAL: Cholesterol: 86 mg/dL (ref 0–200)

## 2010-10-19 LAB — TRIGLYCERIDES: Triglycerides: 74 mg/dL (ref <150)

## 2010-10-19 LAB — PHOSPHORUS: Phosphorus: 2.9 mg/dL (ref 2.3–4.6)

## 2010-10-20 LAB — GLUCOSE, CAPILLARY
Glucose-Capillary: 122 mg/dL — ABNORMAL HIGH (ref 70–99)
Glucose-Capillary: 111 mg/dL — ABNORMAL HIGH (ref 70–99)

## 2010-10-20 LAB — BASIC METABOLIC PANEL
Chloride: 101 mEq/L (ref 96–112)
Creatinine, Ser: 0.63 mg/dL (ref 0.4–1.2)
GFR calc Af Amer: >60 mL/min (ref >60)
Sodium: 133 mEq/L — ABNORMAL LOW (ref 135–145)

## 2010-10-21 LAB — GLUCOSE, CAPILLARY
Glucose-Capillary: 120 mg/dL — ABNORMAL HIGH (ref 70–99)
Glucose-Capillary: 120 mg/dL — ABNORMAL HIGH (ref 70–99)

## 2010-10-21 LAB — BASIC METABOLIC PANEL
BUN: 16 mg/dL (ref 6–23)
Calcium: 8.3 mg/dL — ABNORMAL LOW (ref 8.4–10.5)
GFR calc non Af Amer: >60 mL/min (ref >60)
Glucose, Bld: 101 mg/dL — ABNORMAL HIGH (ref 70–99)
Sodium: 135 mEq/L (ref 135–145)

## 2010-10-22 LAB — COMPREHENSIVE METABOLIC PANEL
AST: 53 U/L — ABNORMAL HIGH (ref 0–37)
Albumin: 1.5 g/dL — ABNORMAL LOW (ref 3.5–5.2)
Calcium: 8.3 mg/dL — ABNORMAL LOW (ref 8.4–10.5)
Creatinine, Ser: 0.61 mg/dL (ref 0.4–1.2)
GFR calc Af Amer: >60 mL/min (ref >60)
GFR calc non Af Amer: >60 mL/min (ref >60)

## 2010-10-22 LAB — GLUCOSE, CAPILLARY
Glucose-Capillary: 112 mg/dL — ABNORMAL HIGH (ref 70–99)
Glucose-Capillary: 139 mg/dL — ABNORMAL HIGH (ref 70–99)
Glucose-Capillary: 105 mg/dL — ABNORMAL HIGH (ref 70–99)

## 2010-10-23 ENCOUNTER — Inpatient Hospital Stay
Admission: AD | Admit: 2010-10-23 | Discharge: 2010-11-18 | Disposition: A | Discharge status: Home / Self Care | Payer: Self-pay | Source: Ambulatory Visit | Attending: Internal Medicine | Admitting: Internal Medicine

## 2010-10-23 LAB — BASIC METABOLIC PANEL
CO2: 26 mEq/L (ref 19–32)
Calcium: 8.4 mg/dL (ref 8.4–10.5)
Glucose, Bld: 103 mg/dL — ABNORMAL HIGH (ref 70–99)
Sodium: 136 mEq/L (ref 135–145)

## 2010-10-23 LAB — GLUCOSE, CAPILLARY

## 2010-10-24 LAB — COMPREHENSIVE METABOLIC PANEL
AST: 35 U/L (ref 0–37)
Albumin: 1.8 g/dL — ABNORMAL LOW (ref 3.5–5.2)
Calcium: 8.3 mg/dL — ABNORMAL LOW (ref 8.4–10.5)
Chloride: 101 mEq/L (ref 96–112)
Creatinine, Ser: 0.65 mg/dL (ref 0.4–1.2)
GFR calc Af Amer: >60 mL/min (ref >60)
Total Bilirubin: 0.2 mg/dL — ABNORMAL LOW (ref 0.3–1.2)
Total Protein: 5.4 g/dL — ABNORMAL LOW (ref 6.0–8.3)

## 2010-10-24 LAB — PREALBUMIN: Prealbumin: 11 mg/dL — ABNORMAL LOW (ref 17.0–34.0)

## 2010-10-25 LAB — CLOSTRIDIUM DIFFICILE BY PCR: Toxigenic C. Difficile by PCR: NEGATIVE

## 2010-10-26 LAB — CBC
Hemoglobin: 10 g/dL — ABNORMAL LOW (ref 12.0–15.0)
RBC: 3.48 MIL/uL — ABNORMAL LOW (ref 3.87–5.11)
WBC: 3.9 10*3/uL — ABNORMAL LOW (ref 4.0–10.5)

## 2010-10-26 LAB — MAGNESIUM: Magnesium: 1.7 mg/dL (ref 1.5–2.5)

## 2010-10-26 LAB — PHOSPHORUS: Phosphorus: 3 mg/dL (ref 2.3–4.6)

## 2010-10-26 LAB — BASIC METABOLIC PANEL
CO2: 23 mEq/L (ref 19–32)
Calcium: 8.4 mg/dL (ref 8.4–10.5)
Creatinine, Ser: 0.62 mg/dL (ref 0.4–1.2)
GFR calc Af Amer: >60 mL/min (ref >60)
GFR calc non Af Amer: >60 mL/min (ref >60)

## 2010-10-27 LAB — CBC
HCT: 31.2 % — ABNORMAL LOW (ref 36.0–46.0)
MCHC: 32.7 g/dL (ref 30.0–36.0)
Platelets: 304 10*3/uL (ref 150–400)
RDW: 14.5 % (ref 11.5–15.5)

## 2010-10-27 LAB — BASIC METABOLIC PANEL
BUN: 16 mg/dL (ref 6–23)
Calcium: 8.2 mg/dL — ABNORMAL LOW (ref 8.4–10.5)
GFR calc non Af Amer: >60 mL/min (ref >60)
Glucose, Bld: 90 mg/dL (ref 70–99)
Sodium: 134 mEq/L — ABNORMAL LOW (ref 135–145)

## 2010-10-29 ENCOUNTER — Other Ambulatory Visit (HOSPITAL_COMMUNITY): Payer: Self-pay | Attending: Internal Medicine

## 2010-10-29 DIAGNOSIS — Z139 Encounter for screening, unspecified: Secondary | ICD-10-CM | POA: Insufficient documentation

## 2010-10-29 LAB — URINALYSIS, ROUTINE W REFLEX MICROSCOPIC
Hgb urine dipstick: NEGATIVE
Ketones, ur: NEGATIVE mg/dL
Protein, ur: NEGATIVE mg/dL
Urine Glucose, Fasting: NEGATIVE mg/dL

## 2010-10-29 LAB — BASIC METABOLIC PANEL
BUN: 24 mg/dL — ABNORMAL HIGH (ref 6–23)
CO2: 23 mEq/L (ref 19–32)
Calcium: 8.5 mg/dL (ref 8.4–10.5)
Chloride: 101 mEq/L (ref 96–112)
Creatinine, Ser: 0.71 mg/dL (ref 0.4–1.2)
GFR calc Af Amer: >60 mL/min (ref >60)
GFR calc non Af Amer: >60 mL/min (ref >60)
Glucose, Bld: 135 mg/dL — ABNORMAL HIGH (ref 70–99)
Potassium: 4.3 mEq/L (ref 3.5–5.1)
Sodium: 131 mEq/L — ABNORMAL LOW (ref 135–145)

## 2010-10-29 LAB — CBC
Platelets: 278 10*3/uL (ref 150–400)
RBC: 3.61 MIL/uL — ABNORMAL LOW (ref 3.87–5.11)
WBC: 6.1 10*3/uL (ref 4.0–10.5)

## 2010-10-29 LAB — URINE MICROSCOPIC-ADD ON

## 2010-10-30 LAB — MAGNESIUM: Magnesium: 1.6 mg/dL (ref 1.5–2.5)

## 2010-10-30 LAB — BASIC METABOLIC PANEL
BUN: 21 mg/dL (ref 6–23)
Creatinine, Ser: 0.8 mg/dL (ref 0.4–1.2)
GFR calc non Af Amer: >60 mL/min (ref >60)
Glucose, Bld: 108 mg/dL — ABNORMAL HIGH (ref 70–99)
Potassium: 4 mEq/L (ref 3.5–5.1)

## 2010-10-30 LAB — CBC
Hemoglobin: 9.3 g/dL — ABNORMAL LOW (ref 12.0–15.0)
MCH: 28.7 pg (ref 26.0–34.0)
MCHC: 33.3 g/dL (ref 30.0–36.0)
RDW: 15 % (ref 11.5–15.5)

## 2010-10-30 LAB — URINE CULTURE

## 2010-10-30 NOTE — Discharge Summary (Signed)
[name] [name], [name] [name]                  ACCOUNT NO.:  [id number]  MEDICAL RECORD NO.:  [id number]           PATIENT TYPE:  I  LOCATION:  1429                         FACILITY:  [facility]  PHYSICIAN:  Wilmon Arms. Corliss Skains, M.D. DATE OF BIRTH:  [birth year]  DATE OF ADMISSION:  09/21/2010 DATE OF DISCHARGE:  10/23/2010                              DISCHARGE SUMMARY   ADMITTING PHYSICIAN:  Charlcie Cradle. Delford Field, MD, [facility]  DISCHARGING PHYSICIAN:  Wilmon Arms. Corliss Skains, M.D.  CONSULTANTS: 1. Lorne Skeens. Hoxworth, M.D. with General Surgery. 2. Jesse Sans. Daleen Squibb, MD, [facility] with Cardiology.  PROCEDURES:  Exploratory laparotomy with resection of terminal ileum and cecum with anastomosis on September 21, 2010.  REASON FOR ADMISSION:  [name] is an 76 year old female who has a prior history of a low anterior resection of the rectum for cancer approximately 10 years ago by Dr. Earlene Plater.  She then had a re-exploration several weeks later for a bowel obstruction.  The patient presents with about a 1-week history of persistent nausea and vomiting with lower abdominal pain.  She presented to the emergency department hypotensive and in acute renal failure with marked hypokalemia.  CT scan was done at that time, which revealed markedly dilated loops of small bowel in the pelvis worrisome for a closed loop bowel obstruction.  Please see admitting history and physical for further details.  ADMITTING DIAGNOSES: 1. Closed loop small bowel obstruction. 2. History of colon cancer. 3. History of small bowel obstruction with resection. 4. Acute renal failure. 5. Hypokalemia. 6. Metabolic alkalosis.  HOSPITAL COURSE:  At this time, the patient was admitted and aggressively resuscitated.  Once she was felt stable, she was taken to the operating room where she underwent an exploratory laparotomy with an ileocecostomy secondary to ischemic gangrenous bowel.  The patient tolerated the procedure well and postoperatively went to the  ICU for further evaluation.  From a surgical standpoint, postoperatively, the patient initially developed a postoperative ileus.  She had an NG tube in place for several days.  Eventually, this was able to be discontinued on postoperative day # five.  By postoperative day #3, she was stable for transfer to regular telemetry floor.  At this time, after the NG tube was discontinued, the patient started having multiple bouts of diarrhea.  Her diet though was advanced as tolerated.  Secondary to her diarrhea, Clostridium difficile was checked and this was negative.  The patient's diet was subsequently advanced to a heart-healthy diet on postoperative day #9.  She was subsequently started also on Imodium secondary to continued diarrhea.  On postoperative day #11, it was noted that the patient began feeling very weak and got hypotensive with a blood pressure in the 80s/50s.  The patient continued to have diarrhea. At this time, she was given several IV boluses and a PICC line was placed.  She was then sent to the step-down unit.  The patient subsequently developed what appeared to look like a bowel obstruction. An NG tube then did have to be placed secondary to development of nausea and vomiting.  A CT scan was then performed, which revealed  some thickened colon but this was thought to be secondary to low flow but no colitis or any further complications were seen.  Her NG tube was continued.  TPN was started at this time secondary to malnutrition and no p.o. intake.  On postoperative day #17, the patient's abdomen was soft and nondistended.  At this time, her NG tube was clamped.  She had very little residual and her NG tube was then discontinued.  Once again, she was started back on clear liquids and her diet was slowly advanced. The patient finally reached a solid diet; however, secondary to essentially anorexia, the patient was not able to maintain her nutrition.  Therefore, she was kept on  full-dose TNA initially. Eventually, the patient had Megace added for an appetite stimulant.  Her intake did pick up some but not a dramatic amount.  Her TNA was decreased to half rate, and she was trying to maintain the other half of her caloric intake via p.o. food.  This all transpired up until postoperative day #31.  During these last several weeks, the patient has had intermittent diarrhea.  It seems to get much better with Imodium. On the day of discharge, the patient began having some diarrhea again and felt weak.  She was once again replaced on Imodium.  At this time, her abdomen is soft, nontender, nondistended with active bowel sounds. Her incision is well healed.  During her hospitalization, especially upon admission, the patient was found to be in acute renal failure.  This very quickly resolved within the first week postoperative, and the patient had essentially no further issues with renal failure in the rest of the hospitalization.  This was felt to be secondary to dehydration.  Also, on admission, the patient was noted to have significant elevation in her LFTs associated with shock liver secondary to her overall sepsis. This also very quickly resolved postoperatively.  Also, on admission, the patient was noted to have an elevated troponin over 2 as well as elevated CK and CK-MB.  Cardiology was asked to evaluate the patient.  It was felt at this time the patient had a non-ST- elevated myocardial infarction.  Later on in the hospitalization, the patient also was noted to have paroxysmal atrial fibrillation.  At this time, she was placed on a Cardizem drip and was ultimately changed from a drip to p.o. Cardizem for rate control.  The patient has been noted to have small episode of nonsustained SVT as well as some trigeminy; however, these very quickly resolved.  Otherwise, the patient cardiac wise has remained stable throughout the hospitalization.  Throughout the  hospitalization, because of her hospital course, the patient has progressively become more deconditioned.  Physical therapy has been working with the patient.  It was initially recommended the patient would be good to go home with home health physical therapy. However, due to decompensation, eventually she was recommended to go to a skilled nursing facility for further rehab.  That is still the current recommendation; however, secondary to ongoing TNA, the patient may not go to a skilled nursing facility but to a long-term acute care facility.  The patient also had some episodes of anemia.  She did require blood transfusion of 2 units of packed red blood cells.  There was no known source of bleeding and felt to be a result of resuscitation and fluid shift.  The patient for the rest of hospitalization remained stable around a hemoglobin of 9.  The patient also had difficulties with hypokalemia  throughout the hospitalization.  Every several days, the patient would become hypokalemic after replacement.  She continues to get approximately 40 mEq of potassium every several days and this will need to be closely monitored in the [facility] facility.  At this time, on postoperative day #31, the patient is felt to be stable for discharge to an LTAC facility for further care.  At this time, the patient will need to remain on TNA as a result of poor appetite and anorexia as the patient is not able to maintain her nutrition on her own.  Once the patient is able to maintain her nutrition on her own, it is felt that the patient would be appropriate for skilled nursing facility or to return home.  Based on her conditioning status at that time, also TNA can be discontinued.  DISCHARGE DIAGNOSES: 1. Closed loop small bowel obstruction. 2. Status post exploratory laparotomy with ileocecostomy for closed     loop small bowel obstruction. 3. Metabolic alkalosis, resolved. 4. Acute renal failure,  resolved. 5. Shock liver, resolved. 6. Protein-calorie malnutrition requiring total nutrient admixture. 7. Paroxysmal atrial fibrillation. 8. Non-ST-elevated myocardial infarction. 9. Deconditioning. 10.Recurrent hypokalemia. 11.Anemia. 12.Diarrhea secondary to short gut syndrome. 13.Postoperative ileus/partial small bowel obstruction which has     resolved. 14.History of rectal cancer status post low anterior resection. 15.History of hypertension, not requiring treatment at this time.  DISCHARGE MEDICATIONS:  Per her hospital MAR these include: 1. Antiseptic mouth rinse at 8 a.m. and 8 p.m., nutritional support     protocol. 2. Protonix 40 mg p.o. daily. 3. Diltiazem 120 mg p.o. daily. 4. Isosorbide mononitrate 30 mg p.o. daily. 5. Detrol 2 mg p.o. daily. 6. Insulin aspart NovoLog subcu t.i.d. with meals.  This is sliding     scale. 7. Megace 400 mg p.o. daily. 8. D5 at a rate of 50 cc/hour. 9. Normal saline IV fluid 10 mL q.12 h. for flushing lines. 10.Ensure p.o. t.i.d. 12.Mechanical DVT prophylaxis including SCDs. 13.Zofran 4 mg IV q.6 h. p.r.n. nausea. 14.Artificial tears 1 drop in each eye t.i.d. p.r.n. 15.Chloraseptic spray as needed. 16.Tylenol 650 mg p.o. q.4 h. p.r.n. 17.Vicodin 5/325 one to two p.o. q.4 h. p.r.n. pain. 18.Hydromorphone 0.5-1 mg IV q.3 h. p.r.n. pain. 19.Imodium 2 mg p.o. after each loose bowel movement up to 16 mg per     day p.r.n. diarrhea, hold for constipation.  DISCHARGE INSTRUCTIONS:  The patient may increase her activity slowly and walk up steps.  She will be encouraged to have physical therapy for rehabilitation at the [facility] facility.  She otherwise does not need to shower but continue bed baths given her PICC line.  She is encouraged to maintain a regular select diet.  However, it would be beneficial for her food to be chopped up prior to being brought to her as this is just typically easier for her to eat.  The patient otherwise should  remain on half strength TNA which is set right now at 25 mL per hour and so the patient can maintain her nutritional status with p.o. intake alone.  The patient has currently been placed on Megace to help as an appetite stimulant for this and should continue on this.  The patient will need to follow up with Dr. Johna Sheriff approximately 2-3 weeks after discharge.  If for some reason the patient remains in the facility for longer than 3 weeks, you can always call us back and we will be glad to see her as an inpatient in  the LTAC facility.  Otherwise, the patient will need to follow up with Dr. Elease Hashimoto in his office for further cardiac care.     Letha Cape, PA   ______________________________ Wilmon Arms. Corliss Skains, M.D.    KEO/MEDQ  D:  10/23/2010  T:  10/23/2010  Job:  [id number]  cc:   Charlcie Cradle. Delford Field, MD, FCCP [address] [place] [address]  Vesta Mixer, M.D. Fax: [phone]  Lorne Skeens. Hoxworth, M.D. [address] [place] [address]  Ernestina Penna, M.D. Fax: [phone]  Electronically Signed by Barnetta Chapel PA on 10/28/2010 03:33:23 PM Electronically Signed by Manus Rudd M.D. on 10/30/2010 04:08:16 PM

## 2010-10-31 LAB — CBC
HCT: 25.7 % — ABNORMAL LOW (ref 36.0–46.0)
Hemoglobin: 8.6 g/dL — ABNORMAL LOW (ref 12.0–15.0)
MCV: 86.5 fL (ref 78.0–100.0)
WBC: 4.6 10*3/uL (ref 4.0–10.5)

## 2010-10-31 LAB — BASIC METABOLIC PANEL
BUN: 24 mg/dL — ABNORMAL HIGH (ref 6–23)
CO2: 24 mEq/L (ref 19–32)
Chloride: 104 mEq/L (ref 96–112)
Glucose, Bld: 131 mg/dL — ABNORMAL HIGH (ref 70–99)
Potassium: 3.9 mEq/L (ref 3.5–5.1)
Sodium: 136 mEq/L (ref 135–145)

## 2010-11-01 LAB — CLOSTRIDIUM DIFFICILE BY PCR: Toxigenic C. Difficile by PCR: NEGATIVE

## 2010-11-02 ENCOUNTER — Other Ambulatory Visit (HOSPITAL_COMMUNITY): Payer: Medicare Other | Attending: Internal Medicine

## 2010-11-02 LAB — BASIC METABOLIC PANEL
CO2: 22 mEq/L (ref 19–32)
Chloride: 106 mEq/L (ref 96–112)
GFR calc Af Amer: >60 mL/min (ref >60)
Glucose, Bld: 98 mg/dL (ref 70–99)
Potassium: 4.4 mEq/L (ref 3.5–5.1)
Sodium: 133 mEq/L — ABNORMAL LOW (ref 135–145)

## 2010-11-02 LAB — CBC
HCT: 30.8 % — ABNORMAL LOW (ref 36.0–46.0)
Hemoglobin: 10.2 g/dL — ABNORMAL LOW (ref 12.0–15.0)
RBC: 3.55 MIL/uL — ABNORMAL LOW (ref 3.87–5.11)
RDW: 15.3 % (ref 11.5–15.5)
WBC: 4.8 10*3/uL (ref 4.0–10.5)

## 2010-11-02 LAB — URINALYSIS, ROUTINE W REFLEX MICROSCOPIC
Nitrite: NEGATIVE
Specific Gravity, Urine: 1.024 (ref 1.005–1.030)
Urobilinogen, UA: 0.2 mg/dL (ref 0.0–1.0)
pH: 6 (ref 5.0–8.0)

## 2010-11-02 LAB — PREALBUMIN: Prealbumin: 10.3 mg/dL — ABNORMAL LOW (ref 17.0–34.0)

## 2010-11-02 LAB — PHOSPHORUS: Phosphorus: 3.3 mg/dL (ref 2.3–4.6)

## 2010-11-02 LAB — URINE MICROSCOPIC-ADD ON

## 2010-11-03 LAB — URINE CULTURE
Colony Count: >=100000
Culture  Setup Time: 201202131609

## 2010-11-03 LAB — PROCALCITONIN: Procalcitonin: 0.24 ng/mL

## 2010-11-04 LAB — URINALYSIS, MICROSCOPIC ONLY
Bilirubin Urine: NEGATIVE
Ketones, ur: NEGATIVE mg/dL
Protein, ur: NEGATIVE mg/dL
Urine Glucose, Fasting: NEGATIVE mg/dL
pH: 7 (ref 5.0–8.0)

## 2010-11-04 LAB — CULTURE, BLOOD (ROUTINE X 2)
Culture  Setup Time: 201202091101
Culture: NO GROWTH

## 2010-11-04 NOTE — Consult Note (Signed)
[name] [name], [name] [name]                  ACCOUNT NO.:  [id number]  MEDICAL RECORD NO.:  [id number]          PATIENT TYPE:  INP  LOCATION:  1414                         [facility]ACILITY:  [facility]  PHYSICIAN:  Sherece Gambrill C. Kaj Vasil, MD, FACCDATE OF BIRTH:  04-08-1924  DATE OF CONSULTATION:  09/24/2010 DATE OF DISCHARGE:                                CONSULTATION   PRIMARY CARE PHYSICIAN:  Ernestina Penna, M.D. in [place].  PRIMARY CARDIOLOGIST:  New patient to [facility], currently being seen by Dr. Daleen Squibb.  REASON FOR CONSULTATION:  Non-ST elevation myocardial infarction.  HISTORY OF PRESENT ILLNESS:  This is an 77 year old elderly female without known coronary artery disease who presents to [facility] with complaints of several weeks of nausea and vomiting and found to have a severe high-grade short bowel obstruction with ischemic gut.  The patient is now postop day #3 from an exploratory laparoscopy with resection of terminal ileum and cecum with anastomosis.  The patient's stay has been complicated by a pressor-dependent shock.  The patient is now off pressors and doing well.  She has ambulated short distances and is sitting in the chair.  The patient still has NG tube in place.  The patient's cardiac enzymes were cycled postoperatively and shown to be elevated.  She had a max troponin of 2.27.  Cardiac enzymes are on a downward trend.  Cardiology was asked to evaluate the patient for further recommendations.  The patient does have a history of hypertension.  She had a cardiac workup in 2001, which included a stress test that was prior to colectomy for cancer.  Per the patient, stress test was normal.  She denies any chest pain or shortness of breath.  The patient is independent and lives alone.  She is able to do housework and grocery shop without difficulty. Of note, an echocardiogram in 2008 was normal with an ejection fraction of 60% to 70%.  The patient is without any complaints of chest pain  or shortness of breath.  She does have mild abdominal pain appropriate per recent surgery.  PAST MEDICAL HISTORY: 1. Hypertension. 2. Colon cancer, status post low anterior resection and anastomosis of     bowel with associated radiation therapy.  Then one month later,     there was an exploratory laparotomy with small bowel resection with     polypectomy in 2001. 3. Osteoporosis. 4. Depression. 5. Cataracts, status post repair. 6. Status post low back surgery. 7. Status post appendectomy. 8. Status post C-section.  SOCIAL HISTORY:  The patient lives in [place] by herself.  She is independent.  The patient is widowed.  She denies any tobacco, alcohol, or illicit drug use.  FAMILY HISTORY:  Pertinent for myocardial infarction in her siblings between the age of 27 and 25.  She has a sister without known coronary artery disease.  ALLERGIES/INTOLERANCES: 1. EQUANIL. 2. VIOXX. 3. CELEBREX.  INPATIENT MEDICATIONS: 1. Hydrocortisone daily. 2. Lovenox 30 mg subcutaneously daily. 3. Protonix 40 mg daily. 4. NovoLog sliding scale insulin. 5. Solu-Cortef daily. 6. Potassium chloride 2 mEq/mL injection q.8 h.  REVIEW OF SYSTEMS:  All pertinent  positives and negatives as stated in the HPI.  All other systems have been reviewed and are negative.  PHYSICAL EXAMINATION:  VITAL SIGNS:  Temperature 98.1, pulse 58-65, respirations 16, blood pressure 142/89, O2 saturation 96% on room air. GENERAL:  This is a polite-appearing elderly female.  She is in no acute distress. HEENT:  Normal. NECK:  Supple without bruit or JVD. HEART:  Regular rate and rhythm with S1 and S2.  No S3, rub or murmur heard.  PMI is normal.  Pulses are 2+ and equal bilaterally. LUNGS:  Decreased breath sounds on the right lower lobe, otherwise, clear to auscultation bilaterally. ABDOMEN:  Soft, decreased bowel sounds, appropriately tender. EXTREMITIES:  No clubbing, cyanosis, or edema. MUSCULOSKELETAL:  No joint  deformity or effusions. NEURO:  Alert and oriented x3, cranial nerves II through XII are grossly intact.  Chest x-ray is showing left lower lobe atelectasis with infiltrates and interstitial edema.  EKG is showing sinus rhythm with occasional PVCs at a rate of 89 beats per minute.  There are diffuse ST upsloping depressions in the inferolateral leads.  No Q waves.  No prior tracing for comparison.  LABORATORY DATA:  WBC of 11.9, hemoglobin 10.4, hematocrit 32, platelets 90.  Sodium 151, potassium 3.5, chloride 125, bicarb 24, BUN 54, creatinine 1.97.  Creatinine kinase 73, 383, 352, 331; CK-MB 3.5, 9.2, 7, 5.8; troponin I 2.27, 0.7, 0.47, 0.33.  ASSESSMENT/PLAN:  This is an 77 year old female with history of hypertension who was admitted for high-grade small bowel obstruction with ischemic gut.  The patient is now postop day #3 from exploratory laparotomy and resection of terminal ileum and cecum with anastomosis. The patient is now with non-ST elevation myocardial infarction.  We suspect the patient has multivessel disease considering her age and EKG. The patient has no acute changes on EKG at this time.  We will start nitroglycerin paste if the patient is still NPO.  We would suggest adding aspirin suppository when this is okayed by Careers adviser.  Now, we will also check a 2-D echo for left ventricular function.  We would also like to risk stratify the patient with a Myoview prior to discharge.  Further recommendations will be dependent upon the above results and the patient's progress.  Thank you for this consultation, we will continue to follow along with you.     Leonette Monarch, PA-C   ______________________________ Jesse Sans Daleen Squibb, MD, [facility]    NB/MEDQ  D:  09/24/2010  T:  09/25/2010  Job:  [id number]  Electronically Signed by Alen Blew P.A. on 10/27/2010 04:09:58 PM Electronically Signed by Valera Castle MD FACC on 11/04/2010 09:40:02 AM

## 2010-11-05 LAB — URINE CULTURE: Colony Count: NO GROWTH

## 2010-11-05 LAB — BASIC METABOLIC PANEL
BUN: 25 mg/dL — ABNORMAL HIGH (ref 6–23)
CO2: 22 mEq/L (ref 19–32)
Chloride: 106 mEq/L (ref 96–112)
Creatinine, Ser: 0.81 mg/dL (ref 0.4–1.2)
Glucose, Bld: 85 mg/dL (ref 70–99)
Potassium: 4.2 mEq/L (ref 3.5–5.1)

## 2010-11-05 LAB — CATH TIP CULTURE: Culture: NO GROWTH

## 2010-11-05 LAB — CBC
HCT: 28.2 % — ABNORMAL LOW (ref 36.0–46.0)
Hemoglobin: 9.3 g/dL — ABNORMAL LOW (ref 12.0–15.0)
MCH: 28.4 pg (ref 26.0–34.0)
MCV: 86.2 fL (ref 78.0–100.0)
Platelets: 221 10*3/uL (ref 150–400)
RBC: 3.27 MIL/uL — ABNORMAL LOW (ref 3.87–5.11)
WBC: 5.5 10*3/uL (ref 4.0–10.5)

## 2010-11-05 LAB — MAGNESIUM: Magnesium: 1.6 mg/dL (ref 1.5–2.5)

## 2010-11-06 LAB — CBC
HCT: 28 % — ABNORMAL LOW (ref 36.0–46.0)
MCH: 28.6 pg (ref 26.0–34.0)
MCV: 87 fL (ref 78.0–100.0)
RDW: 15.9 % — ABNORMAL HIGH (ref 11.5–15.5)
WBC: 5.7 10*3/uL (ref 4.0–10.5)

## 2010-11-06 LAB — BASIC METABOLIC PANEL
BUN: 29 mg/dL — ABNORMAL HIGH (ref 6–23)
Chloride: 110 mEq/L (ref 96–112)
Creatinine, Ser: 0.92 mg/dL (ref 0.4–1.2)
GFR calc non Af Amer: 58 mL/min — ABNORMAL LOW (ref >60)
Glucose, Bld: 121 mg/dL — ABNORMAL HIGH (ref 70–99)
Potassium: 4.5 mEq/L (ref 3.5–5.1)

## 2010-11-10 LAB — CULTURE, BLOOD (ROUTINE X 2)
Culture  Setup Time: 201202150016
Culture  Setup Time: 201202150131
Culture: NO GROWTH

## 2010-11-10 LAB — CLOSTRIDIUM DIFFICILE BY PCR: Toxigenic C. Difficile by PCR: NEGATIVE

## 2010-11-11 LAB — BASIC METABOLIC PANEL
BUN: 26 mg/dL — ABNORMAL HIGH (ref 6–23)
Calcium: 8.8 mg/dL (ref 8.4–10.5)
Creatinine, Ser: 0.75 mg/dL (ref 0.4–1.2)
GFR calc non Af Amer: >60 mL/min (ref >60)

## 2010-11-11 LAB — CBC
Platelets: 232 10*3/uL (ref 150–400)
RDW: 16.6 % — ABNORMAL HIGH (ref 11.5–15.5)
WBC: 5.8 10*3/uL (ref 4.0–10.5)

## 2010-11-11 LAB — DIFFERENTIAL
Basophils Absolute: 0.1 10*3/uL (ref 0.0–0.1)
Eosinophils Absolute: 0.2 10*3/uL (ref 0.0–0.7)
Eosinophils Relative: 3 % (ref 0–5)
Neutrophils Relative %: 58 % (ref 43–77)

## 2010-11-13 LAB — CBC
Platelets: 310 10*3/uL (ref 150–400)
RBC: 4.03 MIL/uL (ref 3.87–5.11)
RDW: 17.2 % — ABNORMAL HIGH (ref 11.5–15.5)
WBC: 8.3 10*3/uL (ref 4.0–10.5)

## 2010-11-13 LAB — BASIC METABOLIC PANEL
Chloride: 110 mEq/L (ref 96–112)
Creatinine, Ser: 0.85 mg/dL (ref 0.4–1.2)
GFR calc Af Amer: >60 mL/min (ref >60)
GFR calc non Af Amer: >60 mL/min (ref >60)
Potassium: 4.4 mEq/L (ref 3.5–5.1)

## 2010-11-13 LAB — DIFFERENTIAL
Basophils Relative: 1 % (ref 0–1)
Eosinophils Absolute: 0.2 10*3/uL (ref 0.0–0.7)
Neutrophils Relative %: 41 % — ABNORMAL LOW (ref 43–77)

## 2010-11-13 LAB — STOOL CULTURE

## 2010-11-16 LAB — BASIC METABOLIC PANEL
CO2: 20 mEq/L (ref 19–32)
Calcium: 8.9 mg/dL (ref 8.4–10.5)
Chloride: 107 mEq/L (ref 96–112)
GFR calc Af Amer: >60 mL/min (ref >60)
Sodium: 133 mEq/L — ABNORMAL LOW (ref 135–145)

## 2010-11-16 LAB — POTASSIUM: Potassium: 3.9 mEq/L (ref 3.5–5.1)

## 2010-11-16 LAB — PREALBUMIN: Prealbumin: 17.8 mg/dL — ABNORMAL LOW (ref 17.0–34.0)

## 2010-11-17 LAB — BASIC METABOLIC PANEL
BUN: 27 mg/dL — ABNORMAL HIGH (ref 6–23)
Chloride: 109 mEq/L (ref 96–112)
Creatinine, Ser: 0.83 mg/dL (ref 0.4–1.2)
GFR calc Af Amer: >60 mL/min (ref >60)
GFR calc non Af Amer: >60 mL/min (ref >60)

## 2010-11-30 LAB — COMPREHENSIVE METABOLIC PANEL
ALT: 1035 U/L — ABNORMAL HIGH (ref 0–35)
ALT: 790 U/L — ABNORMAL HIGH (ref 0–35)
AST: 462 U/L — ABNORMAL HIGH (ref 0–37)
AST: 941 U/L — ABNORMAL HIGH (ref 0–37)
Albumin: 1.6 g/dL — ABNORMAL LOW (ref 3.5–5.2)
Albumin: 2.4 g/dL — ABNORMAL LOW (ref 3.5–5.2)
Albumin: 3.4 g/dL — ABNORMAL LOW (ref 3.5–5.2)
Alkaline Phosphatase: 65 U/L (ref 39–117)
BUN: 114 mg/dL — ABNORMAL HIGH (ref 6–23)
BUN: 79 mg/dL — ABNORMAL HIGH (ref 6–23)
CO2: 21 mEq/L (ref 19–32)
CO2: 33 mEq/L — ABNORMAL HIGH (ref 19–32)
Calcium: 6.9 mg/dL — ABNORMAL LOW (ref 8.4–10.5)
Calcium: 7.4 mg/dL — ABNORMAL LOW (ref 8.4–10.5)
Chloride: 116 mEq/L — ABNORMAL HIGH (ref 96–112)
Chloride: 87 mEq/L — ABNORMAL LOW (ref 96–112)
Creatinine, Ser: 3.37 mg/dL — ABNORMAL HIGH (ref 0.4–1.2)
Creatinine, Ser: 4.75 mg/dL — ABNORMAL HIGH (ref 0.4–1.2)
Creatinine, Ser: 5.27 mg/dL — ABNORMAL HIGH (ref 0.4–1.2)
GFR calc Af Amer: 11 mL/min — ABNORMAL LOW (ref >60)
GFR calc Af Amer: 16 mL/min — ABNORMAL LOW (ref >60)
GFR calc Af Amer: 9 mL/min — ABNORMAL LOW (ref >60)
GFR calc non Af Amer: 13 mL/min — ABNORMAL LOW (ref >60)
GFR calc non Af Amer: 8 mL/min — ABNORMAL LOW (ref >60)
Glucose, Bld: 133 mg/dL — ABNORMAL HIGH (ref 70–99)
Potassium: 3.7 mEq/L (ref 3.5–5.1)
Sodium: 143 mEq/L (ref 135–145)
Sodium: 144 mEq/L (ref 135–145)
Total Bilirubin: 0.8 mg/dL (ref 0.3–1.2)
Total Bilirubin: 1.1 mg/dL (ref 0.3–1.2)
Total Bilirubin: 1.2 mg/dL (ref 0.3–1.2)
Total Protein: 3.7 g/dL — ABNORMAL LOW (ref 6.0–8.3)
Total Protein: 5 g/dL — ABNORMAL LOW (ref 6.0–8.3)

## 2010-11-30 LAB — CBC
HCT: 36.8 % (ref 36.0–46.0)
HCT: 37.3 % (ref 36.0–46.0)
HCT: 38.3 % (ref 36.0–46.0)
Hemoglobin: 11.9 g/dL — ABNORMAL LOW (ref 12.0–15.0)
Hemoglobin: 17.2 g/dL — ABNORMAL HIGH (ref 12.0–15.0)
MCH: 31.4 pg (ref 26.0–34.0)
MCH: 31.5 pg (ref 26.0–34.0)
MCH: 31.7 pg (ref 26.0–34.0)
MCHC: 31.9 g/dL (ref 30.0–36.0)
MCHC: 32.8 g/dL (ref 30.0–36.0)
MCV: 96.8 fL (ref 78.0–100.0)
MCV: 97.7 fL (ref 78.0–100.0)
MCV: 98.1 fL (ref 78.0–100.0)
Platelets: 129 10*3/uL — ABNORMAL LOW (ref 150–400)
Platelets: 155 10*3/uL (ref 150–400)
RBC: 3.92 MIL/uL (ref 3.87–5.11)
RBC: 5.46 MIL/uL — ABNORMAL HIGH (ref 3.87–5.11)
RDW: 13.4 % (ref 11.5–15.5)
RDW: 13.6 % (ref 11.5–15.5)
WBC: 11.6 10*3/uL — ABNORMAL HIGH (ref 4.0–10.5)
WBC: 13 10*3/uL — ABNORMAL HIGH (ref 4.0–10.5)
WBC: 6.8 10*3/uL (ref 4.0–10.5)

## 2010-11-30 LAB — PROTIME-INR
INR: 1.32 (ref 0.00–1.49)
Prothrombin Time: 16.6 seconds — ABNORMAL HIGH (ref 11.6–15.2)

## 2010-11-30 LAB — URINALYSIS, ROUTINE W REFLEX MICROSCOPIC
Glucose, UA: NEGATIVE mg/dL
Nitrite: NEGATIVE
Protein, ur: 100 mg/dL — AB
Urobilinogen, UA: 0.2 mg/dL (ref 0.0–1.0)

## 2010-11-30 LAB — LIPASE, BLOOD
Lipase: 24 U/L (ref 11–59)
Lipase: 37 U/L (ref 11–59)

## 2010-11-30 LAB — BLOOD GAS, ARTERIAL
Bicarbonate: 21.2 mEq/L (ref 20.0–24.0)
O2 Saturation: 95 %
Patient temperature: 98.6
TCO2: 19.6 mmol/L (ref 0–100)
pH, Arterial: 7.32 — ABNORMAL LOW (ref 7.350–7.400)

## 2010-11-30 LAB — DIFFERENTIAL
Basophils Absolute: 0 10*3/uL (ref 0.0–0.1)
Eosinophils Relative: 0 % (ref 0–5)
Lymphs Abs: 0.5 10*3/uL — ABNORMAL LOW (ref 0.7–4.0)
Monocytes Absolute: 0.8 10*3/uL (ref 0.1–1.0)
Monocytes Relative: 6 % (ref 3–12)
Neutro Abs: 11.7 10*3/uL — ABNORMAL HIGH (ref 1.7–7.7)

## 2010-11-30 LAB — POCT I-STAT 7, (LYTES, BLD GAS, ICA,H+H)
Calcium, Ion: 0.91 mmol/L — ABNORMAL LOW (ref 1.12–1.32)
Hemoglobin: 14.3 g/dL (ref 12.0–15.0)
O2 Saturation: 100 %
Potassium: 2.8 mEq/L — ABNORMAL LOW (ref 3.5–5.1)
Potassium: 3.3 mEq/L — ABNORMAL LOW (ref 3.5–5.1)
Sodium: 141 mEq/L (ref 135–145)
TCO2: 29 mmol/L (ref 0–100)
TCO2: 31 mmol/L (ref 0–100)
pCO2 arterial: 59.2 mmHg (ref 35.0–45.0)
pH, Arterial: 7.405 — ABNORMAL HIGH (ref 7.350–7.400)
pO2, Arterial: 244 mmHg — ABNORMAL HIGH (ref 80.0–100.0)

## 2010-11-30 LAB — OCCULT BLOOD, POC DEVICE: Fecal Occult Bld: NEGATIVE

## 2010-11-30 LAB — CARBOXYHEMOGLOBIN
Methemoglobin: 1 % (ref 0.0–1.5)
O2 Saturation: 72.2 %
Total hemoglobin: 14.9 g/dL (ref 12.5–16.0)

## 2010-11-30 LAB — CULTURE, BLOOD (ROUTINE X 2)
Culture  Setup Time: 201201030551
Culture  Setup Time: 201201031055
Culture: NO GROWTH
Culture: NO GROWTH

## 2010-11-30 LAB — URINE CULTURE
Colony Count: 10000
Culture  Setup Time: 201201021649

## 2010-11-30 LAB — CARDIAC PANEL(CRET KIN+CKTOT+MB+TROPI)
CK, MB: 3.5 ng/mL (ref 0.3–4.0)
CK, MB: 7 ng/mL (ref 0.3–4.0)
CK, MB: 9.2 ng/mL (ref 0.3–4.0)
Relative Index: 2 (ref 0.0–2.5)
Relative Index: 2.4 (ref 0.0–2.5)
Relative Index: INVALID (ref 0.0–2.5)
Total CK: 352 U/L — ABNORMAL HIGH (ref 7–177)
Total CK: 383 U/L — ABNORMAL HIGH (ref 7–177)
Total CK: 73 U/L (ref 7–177)
Troponin I: 0.47 ng/mL — ABNORMAL HIGH (ref 0.00–0.06)
Troponin I: 0.7 ng/mL (ref 0.00–0.06)

## 2010-11-30 LAB — BASIC METABOLIC PANEL
CO2: 24 mEq/L (ref 19–32)
Calcium: 7 mg/dL — ABNORMAL LOW (ref 8.4–10.5)
Chloride: 108 mEq/L (ref 96–112)
Creatinine, Ser: 3.72 mg/dL — ABNORMAL HIGH (ref 0.4–1.2)
GFR calc Af Amer: 12 mL/min — ABNORMAL LOW (ref >60)
Glucose, Bld: 123 mg/dL — ABNORMAL HIGH (ref 70–99)
Potassium: 2.8 mEq/L — ABNORMAL LOW (ref 3.5–5.1)

## 2010-11-30 LAB — AMYLASE: Amylase: 70 U/L (ref 0–105)

## 2010-11-30 LAB — TYPE AND SCREEN: Antibody Screen: NEGATIVE

## 2010-11-30 LAB — PROCALCITONIN: Procalcitonin: 3.28 ng/mL

## 2010-11-30 LAB — GLUCOSE, CAPILLARY
Glucose-Capillary: 74 mg/dL (ref 70–99)
Glucose-Capillary: 98 mg/dL (ref 70–99)

## 2010-11-30 LAB — URINE MICROSCOPIC-ADD ON

## 2010-11-30 LAB — CORTISOL: Cortisol, Plasma: 13.7 ug/dL

## 2010-11-30 LAB — GASTRIC OCCULT BLOOD (1-CARD TO LAB): pH, Gastric: 4

## 2010-12-22 ENCOUNTER — Ambulatory Visit (INDEPENDENT_AMBULATORY_CARE_PROVIDER_SITE_OTHER): Payer: Medicare Other | Admitting: Cardiovascular Disease

## 2010-12-22 ENCOUNTER — Encounter: Payer: Self-pay | Admitting: Cardiovascular Disease

## 2010-12-22 VITALS — BP 146/66 | HR 72 | Wt 131.0 lb

## 2010-12-22 DIAGNOSIS — I4891 Unspecified atrial fibrillation: Secondary | ICD-10-CM

## 2010-12-22 DIAGNOSIS — R609 Edema, unspecified: Secondary | ICD-10-CM

## 2010-12-22 DIAGNOSIS — R600 Localized edema: Secondary | ICD-10-CM | POA: Insufficient documentation

## 2010-12-22 NOTE — Assessment & Plan Note (Signed)
Her atrial fibrillation seems to be fairly stable. Her heart rate is within normal limits. Her echocardiogram reveals normal left ventricular systolic function. I do not think that we need to make any further adjustments at this time. She is on aspirin 325 mg a day. She is not a candidate for Coumadin given her poor nutritional status, poor p.o. Intake, and generalized weakness. She walks with the assistance of a walker and is at very high risk of falling.  We will have Dr. Berneda Rose to follow this as an outpatient. I'll see her back on an as-needed basis but did not need to see her specifically for her atrial fibrillation. She Needs continued rate control.

## 2010-12-22 NOTE — Assessment & Plan Note (Signed)
She has normal left ventricular systolic function. I think that her leg edema is a function of her poor nutrition and lack of mobility. I've encouraged her to elevate her legs as much is possible. She also may need compression hose.

## 2010-12-22 NOTE — Progress Notes (Signed)
History of Present Illness:  [name] [name] is an elderly female with a history of Recent hospitalization for exploratory laparotomy. She had resection of the terminal ileum. This was complicated by rapid atrial fibrillation with a mild bump in her cardiac enzymes. She had a long hospitalization and now is recovering at [facility] in [place], [place].   Her appetite is still poor although she is eating little bit better. She denies any episodes of chest pain or shortness of breath.  She denies any syncope or presyncope. She walks with the assistance of a walker and still very weak.  Current Outpatient Prescriptions  Medication Sig Dispense Refill  . aspirin EC 325 MG EC tablet Take 1 tablet (325 mg total) by mouth.  30 tablet  0  . isosorbide mononitrate (IMDUR) 30 MG 24 hr tablet Take 1 tablet (30 mg total) by mouth daily.  30 tablet  11  . loperamide (IMODIUM A-D) 2 MG tablet Take 1 tablet (2 mg total) by mouth 4 (four) times daily as needed for diarrhea/loose stools.  30 tablet  0  . metoprolol succinate (TOPROL XL) 25 MG 24 hr tablet Take 1 tablet (25 mg total) by mouth daily.  30 tablet  11  . pantoprazole (PROTONIX) 40 MG tablet Take 1 tablet (40 mg total) by mouth daily.  30 tablet  11  . rosuvastatin (CRESTOR) 5 MG tablet Take 1 tablet (5 mg total) by mouth daily.  30 tablet  11  . solifenacin (VESICARE) 10 MG tablet Take 0.5 tablets (5 mg total) by mouth daily.  15 tablet  11  . verapamil (CALAN) 120 MG tablet Take 1 tablet (120 mg total) by mouth 1 dose over 46 hours.  90 tablet  11  . potassium chloride 20 MEQ/15ML (10%) SOLN Take by mouth daily.  450 mL  0  . DISCONTD: losartan (COZAAR) 100 MG tablet Take 1 tablet (100 mg total) by mouth daily.  30 tablet  11     Allergies  Allergen Reactions  . Celebrex (Celecoxib)   . Meprobamate   . Rofecoxib     Past Medical History  Diagnosis Date  . Cancer   . Arrhythmia   . Coronary artery disease   .  Hypertension     Past Surgical History  Procedure Date  . Bowel resection   . Colon cancer   . Appendectomy     History  Smoking status  . Never Smoker   Smokeless tobacco  . Not on file    History  Alcohol Use No    Family History  Problem Relation Age of Onset  . Heart disease Mother   . Heart disease Sister   . Heart disease Brother     Reviw of Systems:  Reviewed in the history of present illness. All other systems are negative. Physical Exam: BP 146/66  Pulse 72  Wt 131 lb (59.421 kg) The patient is alert and oriented x 3.  The mood and affect are normal.  The skin is warm and dry.  Color is normal.  The HEENT exam reveals that the sclera are nonicteric.  The mucous membranes are moist.  The carotids are 2+ without bruits.  There is no thyromegaly.  There is no JVD.  The lungs are clear.  The chest wall is non tender.  The heart exam reveals an irregular rate with a normal S1 and S2.  There are no murmurs, gallops, or rubs.  The PMI is not  displaced.   Abdominal exam reveals good bowel sounds.  There is no guarding or rebound.  There is no hepatosplenomegaly or tenderness.  There are no masses.  Exam of the legs Reveals 1-2+ pitting edema  The legs are without rashes.  The distal pulses are intact.  Cranial nerves II - XII are intact.  Motor and sensory functions are intact.  The gait is normal.  Assessment / Plan:

## 2010-12-23 ENCOUNTER — Telehealth: Payer: Self-pay | Admitting: *Deleted

## 2010-12-23 MED ORDER — POTASSIUM CHLORIDE 20 MEQ/15ML (10%) PO SOLN: 10.0000 meq | Freq: Two times a day (BID) | ORAL | Status: DC

## 2010-12-23 MED ORDER — VERAPAMIL HCL 120 MG PO TABS: ORAL_TABLET | ORAL | Status: DC

## 2010-12-23 MED ORDER — SOLIFENACIN SUCCINATE 10 MG PO TABS: 10.0000 mg | ORAL_TABLET | Freq: Every day | ORAL | Status: DC

## 2010-12-23 NOTE — Telephone Encounter (Signed)
Nettie Elm, RN for [employer] called with an updated medication list; pt was seen 1 day ago;  Will route to Dr. Elease Hashimoto.  [phone]

## 2011-02-02 NOTE — Op Note (Signed)
[name] [name], [name] [name]                  ACCOUNT NO.:  [id number]   MEDICAL RECORD NO.:  [id number]          PATIENT TYPE:  INP   LOCATION:  2853                         FACILITY:  [facility]   PHYSICIAN:  Stefani Dama, M.D.  DATE OF BIRTH:  June 09, 1924   DATE OF PROCEDURE:  10/16/2007  DATE OF DISCHARGE:                               OPERATIVE REPORT   PREOPERATIVE DIAGNOSIS:  Herniated nucleus pulposus, L3-L4 left with  extraforaminal protrusion of disk and left L3 radiculopathy.   POSTOPERATIVE DIAGNOSIS:  Herniated nucleus pulposus, L3-L4 left with  extraforaminal protrusion of disk and left L3 radiculopathy.   OPERATION:  Extraforaminal microdiskectomy L3-L4, left with operating  microscope, microdissection of L3 nerve root.   SURGEON:  Stefani Dama, M.D.   FIRST ASSISTANT:  Dr. Maeola Harman.   ANESTHESIA:  General endotracheal.   INDICATIONS:  [name] [name] is an 77 year old individual who developed a  severe and acute onset of pain in the left lower extremity radiating  from the buttock down to the thigh and stopping at the level of the  knee.  She was in severe excruciating and unrelenting pain for the past  week and a half, so much so that she had a couple of trips to the  emergency department.  She notes that pain medication does little to  help dull the pain.  She has had an MRI of the lumbar spine demonstrates  an extraforaminal protrusion of the disk on the left side at L3-L4.  She  is taken to the operating room now to undergo surgical decompression.   PROCEDURE:  The patient was brought to the operating room supine on the  stretcher.  After smooth induction of general endotracheal anesthesia,  she was turned prone.  The back was prepped with alcohol and DuraPrep  and draped in sterile fashion.  Midline incision was created and the  lower lumbar spine after localizing needle identified the space at L4.  Dissection was carried out into the extraforaminal space and a  second  localizer identified the transverse process and pedicle complex of L4.  The dissection from there was carried little bit cephalad to expose the  L3-L4 extraforaminal space.  Here the inferior articular process and the  transverse process of L3 was identified and this was ground down with  high-speed bur and 3-mm dissecting bit.  The superior articular process  of L4 was identified.  This was taken down also.  With this being  dissected the soft tissues of the area and the intertransverse muscle  was disarticulated.  The dissection then yielded some fatty tissue and  the nerve itself was identified in the foramen.  The nerve was noted be  severely stretched in the dorsal fashion and dissection on the underside  of the nerve yielded a pearlescent surface which was subligamentous  confinement of the disk.  This was dissected free, some epidural veins  were cauterized and divided in this area and then pearlescent mass was  incised with a 15 blade and immediately under substantial pressure  fragments of disk material were evacuated.  This evacuation continued  until the L3 nerve root became quite lax in its travel out the foramen.  Dissection with a coronary dilator into the intraforaminal space to the  extraforaminal space yielded some other small fragments of disk  material.  In the end the L3 nerve root was free in its travel out the  foramen.  Hemostasis from the epidural bleeding veins obtained with  bipolar cautery and the undersurface of the nerve could be palpated free  and no other fragments of disk could be retrieved.  Once this was  identified and hemostasis was adequately achieved, microscope was  withdrawn from the field and the area was infiltrated with 0.5 mL of  fentanyl 50 mcg prior to closure.  Should be noted that the entirety of  the dissection from the dissection the interlaminar space was done using  operating microscope.  The incision was then closed in layers  with 0-0  Vicryl in the lumbodorsal fascia, 2-0 Vicryl subcutaneously, 3-0 Vicryl  subcuticularly.  Dry sterile dressing was applied to the skin.  Blood  loss for the procedure was estimated about 50 mL.      Stefani Dama, M.D.  Electronically Signed     HJE/MEDQ  D:  10/16/2007  T:  10/17/2007  Job:  [id number]

## 2011-02-02 NOTE — Discharge Summary (Signed)
NAMELYDIANA, [name] [name]                  ACCOUNT NO.:  [id number]   MEDICAL RECORD NO.:  [id number]          PATIENT TYPE:  INP   LOCATION:  3014                         FACILITY:  [facility]   PHYSICIAN:  Stefani Dama, M.D.  DATE OF BIRTH:  10/13/1923   DATE OF ADMISSION:  10/16/2007  DATE OF DISCHARGE:  10/19/2007                               DISCHARGE SUMMARY   ADMITTING DIAGNOSES:  Herniated nucleus pulposus, L3-L4 left,  extraforaminal, with left L3 radiculopathy.   DISCHARGE AND FINAL DIAGNOSES:  Herniated nucleus pulposus, L3-L4 left,  with left L3 radiculopathy, extraforaminal disk herniation.   MAJOR OPERATIONS:  Microdiskectomy, L3-L4 left, extraforaminal with  decompression of L3 nerve root on October 16, 2007.   CONDITION ON DISCHARGE:  Improving.   HOSPITAL COURSE:  Ms. Denya Buckingham is an 77 year old individual who had  been functioning fairly well independently.  However, she developed a  severe onset of buttock and right hip and right leg pain.  She was found  to have a large extraforaminal protrusion of the disk at the level of L3-  L4 compromising L3 nerve function.  She had weakness in the quadriceps  on that right side and had severe and excruciating pain.  She was  brought to the hospital where she underwent microdiskectomy and  extraforaminal decompression of the L3 nerve root.  Postoperatively, she  notes that there is still significant pain in that left leg and there is  some weakness in the quadriceps; however, the pain does appear to be  doing better, as she has not been requiring substantial medication.  At  the current time, the patient is being discharged home with a  prescription for Darvocet-N 100, #40.  She has also been fitted for a  walker to use around the house.  She will be staying with her daughter  who also is disabled and incapacitated.  There is another daughter who  was fully functional who will be watching in on both of them.  Discharge  plans did  require some manipulation, as the patient was anticipating  going back her own place of living, but now noting that she still has  some incapacity, she is willing to stay with her disabled daughter for a  period of time until her function is improved.   CONDITION ON DISCHARGE:  Improving.      Stefani Dama, M.D.  Electronically Signed     HJE/MEDQ  D:  10/19/2007  T:  10/19/2007  Job:  [id number]

## 2011-02-05 NOTE — Op Note (Signed)
[place]. [facility]  Patient:    [name] [name], [name] [name]                         MRN: [id number] Proc. Date: 04/04/00 Adm. Date:  [id number] Attending:  Carson Myrtle                           Operative Report  PREOPERATIVE DIAGNOSIS: Carcinoma of the rectum. Need for venous access.  POSTOPERATIVE DIAGNOSIS: Carcinoma of the rectum. Need for venous access.  OPERATION/PROCEDURE:  Placement of Port-A-Cath.  ANESTHESIA: Local.  SURGEON: Timothy E. Earlene Plater, M.D.  INDICATIONS:  The patient is a 77 year old, otherwise healthy, who has a C1 rectal carcinoma and she has elected to proceed with combined postoperative chemotherapy and radiation therapy.  A Port-A-Cath is necessary and this has been planned for today. Her chemotherapy is to start in one week.  DESCRIPTION OF PROCEDURE:  The patient was brought to the operating room and placed supine. IV sedation was given. We agreed to approach her left subclavian as a first attempt. The left subclavian was isolated on the first pass. The wire introduced and ascertained to be in the correct position by real time fluoroscopy. Percutaneous needle removed. The puncture site opened with an 11 blade. Additional local anesthesia provided and the #11 introducer was placed over the wire. The wire removed and ascertained to be in the correct position by fluoroscopy. The plunger of the introducer removed. The catheter of the Port-A-Cath introduced through the sheath and the sheath removed. The Port-A-Cath then positioned correctly under fluoroscopy. All instruments, introducers, and Port-A-Cath had been irrigated and flushed with diluted heparin. It was in good position. We were finished with fluoroscopy at that point. Then, using additional local anesthesia in the left upper chest, a pouch was made in the deep subcutaneous space for the port. The catheter was passed subcutaneously to the port site. The port had been irrigated.  It was connected to the catheter and then the port was sewn to the prepectoral fascia with interrupted 3-0 Prolene sutures. It was in good position. It flushed easily and felt to be in correct position. With the counts correct, the wounds were closed with 2-0 and 3-0 Vicryl. Again, all areas were checked. Steri-Strips were applied to the wounds and concentrated heparin was flushed through the catheter which worked easily. This completed the procedure. The patient was stable and removed to the recovery room.  Written and verbal instructions were given to the patient and she will be seen and followed as an outpatient. DD:  04/04/00 TD:  04/04/00 Job: [id number]

## 2011-02-05 NOTE — Discharge Summary (Signed)
NAMEJIM, PHILEMON                  ACCOUNT NO.:  [id number]   MEDICAL RECORD NO.:  [id number]          PATIENT TYPE:  INP   LOCATION:  4731                         FACILITY:  [facility]   PHYSICIAN:  Lupita Raider, M.D.   DATE OF BIRTH:  07-01-1924   DATE OF ADMISSION:  11/14/2006  DATE OF DISCHARGE:  11/17/2006                               DISCHARGE SUMMARY   ATTENDING PHYSICIAN:  Dr. Tivis Ringer of [facility].   PRIMARY CARE PHYSICIAN:  Dr. Christell Constant of [facility].   CONSULTS:  [facility].   PROCEDURES:  1. Carotid Dopplers that showed no stenosis.  2. 2D Echo that showed an ejection fraction of 60-70% with no left      ventricular regional wall motion abnormalities, but consistently      high left ventricular filling pressures.  3. Adenosine Myoview for which a preliminary read showed no fixed or      reversal defects and a normal left ventricular wall motion.  EF of      78%.  4. Noncontrast head CT that was negative for hemorrhage or stroke.   DISCHARGE DIAGNOSES:  1. Syncope from bradycardia secondary to a beta blocker.  2. Hypertension.  3. History of rectal cancers, status post chemo and radiation in      August of 2001.  4. Status post small bowel obstruction and small bowel resection in      September of 2001.  5. Depression.   DISCHARGE MEDICATIONS:  1. Benicar HCT 25/40 mg 1 tablet p.o. daily.  2. Aspirin 81 mg p.o. daily.  3. Multivitamin 1 tablet p.o. daily.  4. Axid 150 mg p.o. b.i.d.  5. Fish oil.  6. Glucosamine.  7. Citracal.   LABS:  White blood cell count 4.1, hemoglobin 13.5.  Platelets 111 and  were consistently low, but this is not new for the patient.  Coags were  normal with an INR of 1.  D-dimer was negative at 0.37.  Chemistries  were within normal limits including a creatinine of 1.05.  Her troponin  was consistently elevated at 0.07, 0.09, and 0.08.  TSH 2.399.  Hemoccult negative.   FOLLOWUP:  Patient is to follow up with Dr. Christell Constant of [facility] in the next week or two.  She was given the number and  asked to call for an appointment.   HOSPITAL COURSE:  An 77 year old white female who was recently started  on atenolol for hypertension.  Last month was admitted after a syncopal  episode with urinary incontinence thought to be related to symptomatic  bradycardia as her heart was in the 50s on admission.  Workup for a MI  and carotid artery insufficiency or stroke were all negative.  She did  have elevated troponins consistently, but they were not trending up and  cardiology was subsequently consulted and agreed that the source was  likely secondary to her beta blocker and not an MI.  She did get an  adenosine Myoview that did not show any ischemia.  Benicar and HCTZ  doses  were doubled and it is recommended if she needs any further  antihypertensive medications that she avoid negative chronotropes.  Cardiology recommended no driving for 6 months pending clearance by her  primary care physician.  Her atenolol was discontinued.           ______________________________  Lupita Raider, M.D.     KS/MEDQ  D:  11/17/2006  T:  11/18/2006  Job:  [id number]

## 2011-02-05 NOTE — Consult Note (Signed)
[place]. [facility]  Patient:    [name] [name], [name] [name]                         MRN: [id number] Adm. Date:  [id number] Attending:  Pierce Crane CC:         Pierce Crane, M.D.   Consultation Report  REASON FOR CONSULTATION:  Dr. Donnie Coffin asked [place] to see this 77 year old female because of elevation of bilirubin and alkaline phosphatase levels.  HISTORY OF PRESENT ILLNESS:  [name] [name] is known to my partner, Dr. Vida Rigger, from a diagnosis of rectal cancer approximately 3-1/2 months ago, which was followed by a low anterior resection, finding of one lymph node positive, and subsequent chemo-radiation therapy.  The patient was admitted to the hospital two weeks ago with failure to thrive and was ultimately found to have a small bowel obstruction, for which she underwent exploratory surgery, lysis of adhesions, and small bowel resection approximately a week ago.  The operative findings at that time showed a roughly two-foot segment of small bowel stuck down in the pelvis with radiation changes, thickening, and inflammation.  Since the surgery, the patient has made gradual but rather slow improvement. She has been maintained on TNA, and there has been a rise in her liver chemistries, with her total bilirubin going from a normal level of 1.0 on August 30 shortly after admission to a current level of 3.8, in a fairly steady fashion, especially over the past several days.  During this period of time, the alkaline phosphatase level, which was normal at the time of admission, has begun to rise gradually from an initial level of 114 to a current level of 208, again in a progressive fashion.  During this period of time, the patients AST has been slightly above the normal range, and the SGPT mildly above the normal range, in the 40-50 range, whereas these chemistries were completely normal at the time of admission.  The patients serum albumin, interestingly, is less than 1.5.  An  ultrasound today showed gallstones and apparently some thickening of the gallbladder wall (not surprising given her low albumin level) but a normal-caliber common bile duct.  ALLERGIES:  EMETROL.  MEDICATIONS:  Lovenox, morphine, sliding scale insulin (has not been needed for coverage while on TNA), and the above-mentioned TNA.  PAST SURGICAL HISTORY:  A remote appendectomy and her low anterior resection of the rectosigmoid cancer several months ago, plus the recent segmental small bowel resection.  PAST MEDICAL HISTORY:  Rectal cancer and previous history of depression.  HABITS:  Nonsmoker, nondrinker per chart.  FAMILY HISTORY:  Negative for liver disease, positive for colon cancer in a sister.  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  REVIEW OF SYSTEMS:  Not obtained, other than that the patients abdominal pain has improved somewhat since surgery and was located in the midabdominal area, not the upper abdomen.  The patient does not seem to be a completely reliable historian.  For what it is worth, she does mention that it sometimes seemed to go through to her neck.  She also admits to shaking chills.  I wonder whether she may have a tendency for a diffusely positive review of systems.  PHYSICAL EXAMINATION:  GENERAL:  A very quiet, somnolent white female, in no evident distress.  She  may be somewhat depressed.  She is anicteric but has perhaps some slight skin pallor.  CHEST:  Clear to auscultation.  HEART:  Without gallops, rubs, murmurs, clicks, or arrhythmias.  ABDOMEN:  A well-healing low midline incision and active but somewhat diminished bowel sounds.  The liver and the spleen are nonpalpable, and there is no overt right upper quadrant tenderness.  LABORATORY DATA:  See above.  White count has been consistently normal  for the past week.  Hemoglobin 10.4, platelets 507,000.  Ultrasound:  See above.  IMPRESSION: 1. Mild elevation of liver chemistries, primarily in a cholestatic pattern,    new since admission. 2. Status post recent small bowel resection with exploratory surgery for    radiation-induced adhesions and enteritis. 3. History of rectal cancer with one lymph node positive but no known    intrahepatic metastases. 4. Gallstones by ultrasound without biliary ductal dilatation.  DISCUSSION:  On balance, I am quite sure that the elevation in liver chemistries reflects either medications (recently was on Primaxin) and/or TNA-induced cholestasis.  I tend to doubt that choledocholithiasis is responsible for the elevation of the liver chemistries, in part because of the predominance of bilirubin and alkaline phosphatase elevation compared to transaminases, in part because of the apparent absence of discrete, biliary colic-type pain episodes, in part because of the absence of biliary ductal dilatation, and in part because it would seem quite coincidental that, while in the hospital, the patient would experience an abrupt (albeit mild) elevation of liver chemistries as a coincidence from choledocholithiasis when a more plausible explanation would be the concurrent exposure to TNA and antibiotic medication.  RECOMMENDATION:  I would favor expectant management for now.  The test of choice at this time would probably be a hepatobiliary scan, but the patient appears quite weary and I think the test would be a little bit hard on her, and I feel there is a low probability that it would disclose a definite abnormality.  Therefore, I would favor checking liver chemistries every several days and consider the hepatobiliary scanning if there is a progressive rise in liver chemistries and/or discrete episodes of upper abdominal pain suggesting choledocholithiasis.  If the hepatobiliary scan turns out to  be abnormal, we could then move to an MRI cholangiogram as our next step.   At present, I do not feel that there is a sufficient probability of a common duct process to warrant putting the patient through an ERCP, with the attendant risks.  We appreciate the opportunity to have seen this patient in consultation with you. DD:  05/30/00 TD:  05/31/00 Job: [id number]

## 2011-02-05 NOTE — Discharge Summary (Signed)
[facility]  Patient:    KHAMIL, LAMICA                         MRN: [id number] Adm. Date:  [id number] Disc. Date: [id number] Attending:  Carson Myrtle CC:         Petra Kuba, M.D.             Monica Becton, M.D.                           Discharge Summary  FINAL DIAGNOSIS:  Adenocarcinoma of the rectum stage C.  Patient was seen, consulted, worked up as an outpatient.  A carcinoma of the rectum was known and she was prepared for surgery.  No other significant medical problems were known in this healthy 77 year old Caucasian female.  Her preoperative CEA level was normal.  Chest x-ray was normal.  Cardiogram showed left atrial enlargement.  Her admitting chemistry profile was normal.  Her admitting CBC showed a mild anemia, hemoglobin 11.5, hematocrit 33.7.  She was prepared at home, brought in the day of surgery.  Underwent a proctectomy with low anterior anastomosis.  She had a smooth and uncomplicated recovery with a slight prolongation of return of bowel function, but no complications.  Catheters and tubings were removed appropriately.  Diet was started appropriately and she progressed albeit slow, but smooth.  She is now ready for discharge.  She is on a soft, bland diet.  Ambulatory.  Staples removed.  Afebrile.  Pathology report showed one positive lymph node with an invasive adenocarcinoma of the rectum.  She is a stage C1 T2 N1.  Patient is given Phenergan tablets for nausea if needed and Darvocet-N for pain if needed and will be followed closely as an outpatient. DD:  02/05/00 TD:  02/08/00 Job: [id number]

## 2011-02-05 NOTE — Op Note (Signed)
. [facility]  Patient:    [name], [name]                         MRN: [id number] Proc. Date: 05/22/00 Adm. Date:  [id number] Attending:  Pierce Crane                           Operative Report  PREOPERATIVE DIAGNOSIS:  Small bowel obstruction.  POSTOPERATIVE DIAGNOSIS:  Small bowel obstruction.  OPERATION PERFORMED: 1. Exploratory laparotomy. 2. Small bowel resection.  SURGEON:  Abigail Miyamoto, M.D.  ASSISTANT:  Dr. Chevis Pretty.  ANESTHESIA:  General endotracheal.  INDICATIONS FOR PROCEDURE:  [name] [name] is a 77 year old female who is status post a sigmoid and rectal resection for rectal cancer.  She is also status post chemotherapy and radiation therapy.  She was admitted with failure to thrive, dehydration, nausea and vomiting.  She was originally thought to have an ileus but because of persistent dilated loops of small bowel and a CAT scan showing a transition zone of dilated to collapsed small bowel, decision was made to proceed with exploratory laparotomy.  FINDINGS:  The patient was found to have a small bowel obstruction with bowel adherent to the pelvis wtih radiation changes.  Approximately 2-1/2 feet of small bowel was resected which appeared ischemic and thickwalled.  No gross perforation was identified.  DESCRIPTION OF PROCEDURE:  The patient was brought to the operating room and identified as [name] [name].  She was placed supine on the operating table and general anesthesia was induced.  Her abdomen was then prepped and draped in the usual sterile fashion.  Using a #10 blade, a midline incision was then created through the patients previous midline incision.  The incision was carried down through the fascia with the cautery.  The peritoneum was then opened entirely through the incision.  Adhesions of small bowel to the abdominal wall were taken down with the Metzenbaum scissors.  Omentum adherent to the abdominal wall was taken  down with electrocautery as well.  Exploration revealed multiple loops of dilated small bowel with bowel stuck in the pelvis and multiple adhesions were taken down with the cautery.  Most of the bowel was elevated up out of the pelvis with blunt dissection.  A definite obstruction was identified.  Ischemic changes were noticed in the distal small bowel that was in the pelvis and this appeared gray.  No perforation or gangrenous changes were identified.  At this point decision was made to proceed with a small bowel resection.  The patient did have an incidental Meckles diverticulum.  The bowel was transected proximally with a GIA stapler.  A distal area was then transected as well with a GIA stapler.  The mesentery was taken down with clamps and 2-0 silk ties.  The remaining distal small bowel still appeared ischemic, so more had to be resected by transecting further distally with the GIA stapler.  Again, this section of the mesentery was taken down with clamps and silk ties as well.  At this point, side-to-side small bowel anastomosis was performed by first reapproximating the bowel with silk sutures, then performing anastomosis with a GIA 55 stapler.  The open end was then closed with TA-60 stapler.  An adequate anastomosis appeared to be achieved.  The mesenteric defect was then closed with interrupted 3-0 silk sutures.  The abdomen was then copiously irrigated with warm normal saline. Hemostasis appeared  to be achieved.  The fascia was then closed with a running #1 PDS suture.  Skin was then closed with skin staples after irrigation.  The patient tolerated the procedure well.  All sponge, needle and instrument counts were correct at the end of the procedure.  The patient was then extubated in the operating room and taken in stable condition to the recovery room. DD:  05/22/00 TD:  05/24/00 Job: [id number]

## 2011-02-05 NOTE — H&P (Signed)
[place]. [facility]  Patient:    [name] [name], [name] [name]                         MRN: [id number] Adm. Date:  [id number] Disc. Date: [id number] Attending:  Pierce Crane                         History and Physical  ADMISSION DIAGNOSIS:  Rectal cancer status post chemotherapy and current radiation therapy, admitted for dehydration and failure to thrive.  HISTORY OF PRESENT ILLNESS:  [name] [name] is a 77 year old woman with a known history of rectal cancer.  She currently has a history of bright red blood per rectum which had started a number of months prior to diagnosis.  A colonoscopy was performed with biopsies which revealed a mass in the rectum in May of this year.  She underwent resection of this which revealed a mass of 1.5 cm with negative surgical margins.  One of nine perirectal lymph nodes was involved with cancer.  The patient received leucovoran/5-fu for the first week of her radiation treatments and tolerated this relatively well, though, she did have some problems with port insertion and ultimately the port had to be removed given the concerns related to thrombus.  She subsequently was due to start chemotherapy again this week.  She was seen again yesterday in the office and felt quite poorly with some diarrhea she had been taking Immodium, chronically now, for a number of weeks.  She had had poor appetite and she had also noted some vague abdominal discomfort.  She received IV fluids yesterday with a decision made to stop her chemotherapy.  She felt better after her fluids, but overnight, she began to feel unwell again with experiencing nausea and vomiting and diarrhea as well.  It was decided today because of the way she appeared and the way she felt that she would be better served being admitted to the hospital.  PAST MEDICAL HISTORY:  Significant for no other chronic medical problems.  She has a previous psychiatric history with history of  depression.  MEDICATIONS: 1. Multivitamin. 2. Aspirin. 3. Axid. 4. Prempro.  She is a nonsmoker and nonalcohol consumer.  ALLERGIES:  EQUANIL.  PAST SURGICAL HISTORY:  Appendectomy 50 years ago.  FAMILY HISTORY:  She has a sister who had colon cancer and died at age 70. Brother has Hodgkins.  She has a total of five brothers and one sister.  All of her brothers have died, mostly of myocardial infarctions.  She has two adult children who are in good health.  SOCIAL HISTORY:  She has been widowed for 19 years.  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  She is still trying to be as active as possible.  REVIEW OF SYSTEMS:  Apart from her GI complaints and her generalized malaise, she has no other complaints.  PHYSICAL EXAMINATION:  GENERAL:  Ill-appearing woman, looking somewhat despondent as well.  VITAL SIGNS:  Weight 144, blood pressure 117/59, temperature 97.2, pulse 97, respiratory rate 20.  NECK:  No regional adenopathy in the head and neck area.  Trachea midline. Oropharynx appears normal.  LUNGS:  Clear to auscultation and percussion.  HEART:  Unremarkable.  ABDOMEN:  Flat abdomen.  Bowel sounds are faint.  There is no rebound tenderness.  No inguinal adenopathy.  No edema.  IMPRESSION:  PLAN:  Ms. Wamble appears to be unwell.  We did obtain blood work  from yesterday which showed a sodium of 127, potassium 2.7.  Will start IV fluids with potassium replacement and admit her to the hospital.  We will obtain abdominal x-rays to see if she has any evidence of obstruction. Attempt to control her diarrhea.  Check for C-difficile and help her symptomatically. DD:  05/17/00 TD:  05/17/00 Job: [id number]

## 2011-02-05 NOTE — Op Note (Signed)
[facility]  Patient:    [name] [name], [name] [name]                         MRN: [id number] Proc. Date: 01/28/00 Adm. Date:  [id number] Attending:  Carson Myrtle                           Operative Report  PREOPERATIVE DIAGNOSIS:  Carcinoma of the rectum.  POSTOPERATIVE DIAGNOSIS:  Carcinoma of the rectum.  PROCEDURE:  Proctectomy with low anterior resection and anastomosis.  SURGEON:  Timothy E. Earlene Plater, M.D.  ASSISTANT:  Mardene Celeste. Lurene Shadow, M.D.  ANESTHESIA:  C.R.N.A. supervised M.D.  DESCRIPTION OF PROCEDURE:  Patient was brought to the operating room in the holding area, monitors and IVs applied, epidural catheter applied.  She was taken to the operating room, placed supine and general endotracheal anesthesia was administered.  A nasogastric tube was inserted, she was carefully positioned on the table, placed in stirrups and in the modified lithotomy position, the perianal area was inspected, a Foley catheter inserted, and then the perineum prepped and draped.  She was then placed with legs down in the stirrups and the abdomen was prepped; all drapes applied.  A short midline incision was used to enter the abdominal cavity, rounding the umbilicus to the right.  There were no adhesions.  Careful exploration of the upper abdomen revealed it all to be normal and NG in good place.  The small bowel and colon were packed into the upper abdomen and attention was turned to the pelvis. There was no evidence of metastasis in any area.  Uterus and ovaries were present and thought to be normal for age.  The sigmoid was mobile and the tumor could not be palpated.  I then incised the peritoneum around the sigmoid colon and down around the rim of the true pelvis and began blunt dissection. This allowed retraction of the rectum into the field and the tumor was located, as expected, in the left anterior wall of the rectum; it was palpably small.  No other lesions were  noted.  We continued blunt dissection around the lower pelvis and rectum, we divided the lateral rectal stalk between clips and bluntly dissected the rectum off the bladder anteriorly; we were well below the tumor.  There was some bleeding from the pelvis in the presacral area; this was packed with Surgicel.  I transected the colon with the roticulated linear staple device at what I was sure was 4 cm below the tumor.  The colon was then cut across and retracted up into the field.  We then chose a site at the sigmoid colon, well away from the tumor, which would allow for adequate excision of tumor, rectum, lower sigmoid and mesentery, but would allow enough sigmoid colon to reach for anastomosis.  The mesentery was dissected, divided between clamps and carefully tied.  Notably, both ureters had been located, isolated and kept lateral to the dissection.  The end of the sigmoid colon was healthy; blood supply was good.  A hand-sewn pursestring suture of 2-0 Prolene was placed in the cut edge of the distal sigmoid.  The cap of a 25-mm EEA staple device was placed within the sigmoid colon, with the stake extending distally.  We inspected the pelvis; there was still some bleeding. It was rather profuse and a bit scary for a moment, but we did get control.  I was  able to suture ligate the presacral pelvic veins, with absolute control of the bleeding.  I expect we lost 400 cc of blood in this process.  Good attention was given the patient by the anesthesia staff and I did send blood for a type and screen.  This area was carefully visualized and was dry and was irrigated.  I then went below, cleared out the rectum with a proctoscope of liquid stool, inserted a 25-mm EEA device and gently positioned this at the cut-and-stapled stump of the rectum.  I then extended the stake through the anastomosis and the cap on the end of the sigmoid was guided down so that the stacks clicked together  appropriately.  The sigmoid was properly aligned and then the EEA staple device was tightened and at the appropriate time, the apparatus was fired, which stapled and cut the anastomosis.  The EEA device was retracted, carefully inspected, proctoscopy accomplished, showing a normal suture line and no air or water leak.  The donuts were examined and both the proximal and distal were intact, a complete ring.  These were sent separately in formalin for pathology. Meanwhile, the sigmoid colon lay nicely across the pelvis; there was no tension.  Again, the inspection of the bleeding area was made and it remained dry.  The peritoneum of the mesentery was closed on the right side of the sigmoid, all instruments and sponges accounted for and the counts were correct.  Irrigation was perfectly clear.  All viscera were returned to their normal positions and the omentum was draped well down into the pelvis.  With the second counts correct, the abdomen was closed in a single layer with #1 PDS suture, the subcu irrigated and the skin closed with wide skin staples. She was stable and had remained so.  She was removed to the recovery room in good condition. DD:  01/28/00 TD:  02/01/00 Job: [id number]

## 2011-02-05 NOTE — H&P (Signed)
[name], [name]                  ACCOUNT NO.:  [id number]   MEDICAL RECORD NO.:  [id number]          PATIENT TYPE:  INP   LOCATION:  2928                         FACILITY:  [facility]   PHYSICIAN:  Rodena Goldmann, MD   DATE OF BIRTH:  [birth year]   DATE OF ADMISSION:  11/14/2006  DATE OF DISCHARGE:                              HISTORY & PHYSICAL   CHIEF COMPLAINT:  Passed out.   HISTORY OF PRESENT ILLNESS:  [name] [name] is an 77 year old white female  patient, very functional for her age.  Patient has returned this morning  from __________  after doing some errands and she had lunch at 12:30 and  was feeding and carrying her 70-month-old great-granddaughter when her  daughter noticed that she seemed asleep.  They tried to wake her up,  unsuccessful.  The patient was sitting in her chair, head forward,  nonresponsive.  Episode lasted less than 3 minutes and patient recovered  without recalls of the event.  The patient was also diaphoretic and  urine incontinent at that moment.  After that, patient completely  asymptomatic.   REVIEW OF SYSTEMS:  GI:  Rapid transient secondary to a small bowel  resection.  CARDIOVASCULAR:  Patient denies chest pain, no dyspnea on  exertion, no palpitations, no claudication.  RESPIRATORY:  Denies cough,  no sputum, no shortness of breath.  GENITOURINARY:  No complaints.  NEUROLOGICAL:  No tremors.  No gait disturbances.   PAST MEDICAL HISTORY:  1. Rectal carcinoma, status post chemotherapy and radiation therapy in      August of 2001.  2. Status post a small bowel obstruction and small bowel resection in      September of 2001.  3. Depression.  4. Hypertension.  5. Cataracts, status post laser surgery.   MEDICATION LIST:  1. Hydrochlorothiazide 12.5 mg p.o. daily.  2. Aspirin 81 mg p.o. daily.  3. Atenolol 50 mg 1/2 tablet p.o. q.h.s.  4. Benicar 20/12.5 mg daily.   ALLERGIES:  EQUANIL.   SURGICAL HISTORY:  1. Appendectomy 10 years ago.  2.  Proctectomy with low anterior resection and anastomosis of bowel on      May of 2001.  3. Exploratory laparotomy with a small bowel resection and colonoscopy      with polypectomy, first one in September of 2001 and colonoscopy in      June of 2005.   PROCEDURE:  1. Chest:  T-wave contrast media on May of 2007, shows a small      bilateral pulmonary nodule.  2. Patient states having an echocardiogram on Cardiolite more than 10      years ago with normal results.   LABORATORY DATA:  White blood count 5.3, hemoglobin 14.5, hematocrit  41.8, platelets 127 with an ANC of 4.4.  Sodium 140, potassium 3.5,  chloride 102, CO2 28, BUN 19, creatinine 1.05, glucose 112, AST 24, ALT  21, BUN 59, creatinine 0.5.  Total protein 6.9, albumin 3.8.  PT 13.5,  INR 1.0, PTT 2.8.  CK-MB 2.7, troponin-I less than 0.05, myoglobin 104.   Head CT reveals  atrophy with a small-vessel ischemic changes.  No acute  ischemia or __________ or edema.   Chest x-ray negative for acute findings.   EKG:  Sinus bradycardia.   PHYSICAL EXAMINATION:  VITAL SIGNS:  Temperature 97.5.  Heart rate 67.  Respiratory rate 18.  Blood pressure 128/61.  O2 saturation 98% on room  air.  GENERAL:  Patient in no acute distress, very interactive.  HEENT:  Normocephalic.  Pupils equal and reactive to light and  accommodation.  Funduscopic with no papular edema.  Oropharynx normal.  NECK:  Supple.  No adenopathy.  No masses.  Negative JVD.  Permanent  carotid pulses bilaterally.  CARDIOVASCULAR:  S1, S2 normal.  A 2/6 systolic ejection murmur right  upper sternal border, radiated to carotid arteries and left lower  sternal border on the apex.  No rubs or gallops.  RESPIRATORY:  Good respiratory effort.  Clear to auscultation  bilaterally.  ABDOMEN:  Positive bowel sounds.  Soft, nontender, nondistended.  No  hepatosplenomegaly.  LOWER EXTREMITIES:  Peripheral pulses femoral, popliteal and DP 2+  bilaterally.  No edema.  NEURO:   Patient alert and oriented x3.  Cranial nerves II-XII intact.  Normal gait.  Romberg:  Patient cannot do it.  Finger-to-nose, heel-to-  shin normal.  Strength 5/5 in all extremities, and DTRs are 2+.   ASSESSMENT/PLAN:  An 77 year old white female patient who presented to  ED with history of syncopal episode.  Differential diagnoses by etiology:  1. Cardiovascular.  Patient with apparently no prior history of      significant coronary artery disease or myocardial infarction and no      history of breathing disturbances being asymptomatic.  We will rule      out any cardiac or any structural problems.  We will order a 2D      echocardiogram.  Per physical examination, may be some involved      disease that could be artery stenosis versus sclerosis and mitral      valve regurgitation.  We will cycle cardiac enzymes and order EKG      and keep patient on telemetry since EKG was bradycardic to rule out      sick sinus syndrome or tachy-brady syndrome.  Carotid Doppler      ultrasound order it to rule out coronary artery obstruction, even      though these may impact since patient did not have cerebrovascular      accident.  2. Narrowly meditated.  Etiology could be vasovagal that is the      diagnosis of exclusion after other causes ruled out , but is may      explain the urinary incontinence.  Could be also be carotid      stenosis __________ associated with bradycardia.  3. Postural.  We will check orthostatics.  4. Neurological disease.  Transient ischemic attack is a possibility.      There is no cerebrovascular signs and symptoms.  No radiological      events.  Fissure could also be a remote possibility since loss of      consciousness was associated with urinary incontinence, but there      was no hour of postictal state.  Episode was brief and patient did      not have any report of any tonic clonic contractions. 5. We will track as the patient is not on any new regimen and       hemodynamically stable.  6. Hyperglycemia.  There is  no signs or symptoms of dehydration.  7. We will check TSH, fasting lipid pain, CBC and BMP in the morning.      Hemoccult is 2.3.  Cycle cardiac enzymes and do the 2D echo and      carotid Dopplers in the morning.  8. Hypertension.  Continue hydrochlorothiazide 12.5 mg, __________  20      mg daily, and aspirin.  9. Deep venous thrombosis prophylaxis.  SCDs plus early ambulation,      and Protonix 40 mg p.o. daily.  __________ Hep-Lock.      Adrian Blackwater, MD    ______________________________  Rodena Goldmann, MD    IM/MEDQ  D:  11/15/2006  T:  11/15/2006  Job:  [id number]

## 2011-02-05 NOTE — Procedures (Signed)
[facility]  Patient:    [name] [name], [name] [name]                         MRN: [id number] Proc. Date: 02/07/01 Adm. Date:  [id number] Attending:  Nelda Marseille CC:         Pierce Crane, M.D.  Monica Becton, M.D.  Timothy E. Earlene Plater, M.D.   Procedure Report  PROCEDURE:  Colonoscopy with biopsy.  INDICATIONS:  Patient with a history of colon cancer due for repeat screening.  INFORMED CONSENT:  Consent was signed after risk, benefits, methods and options were thoroughly discussed multiple times in the past.  MEDICINES USED:  Demerol 50 mg, Versed 4 mg.  DESCRIPTION OF PROCEDURE:  Rectal inspection was pertinent for external hemorrhoids.  Digital exam was negative.  The pediatric video colonoscope was inserted and just past the rectum was the low anastomosis.  It did have some diverticula but no other abnormalities or obvious mass lesions or ulcerations. The scope passed easily around this area and advanced around the colon to the cecum.  This did require some abdominal pressure and rolling her on her back. The cecum was identified by the appendiceal orifice and the ileocecal valve. In fact, the scope was inserted a very short ways into the terminal ileum which was normal.  Photodocumentation was obtained.  The scope was slowly withdrawn.  The prep was adequate.  There was some liquid stool that required washing and suctioning.  No obvious abnormalities were seen on insertion.  On slow withdrawal through the colon, the cecum, ascending, and transverse were all normal.  Just in the descending, just before the anastomosis on withdrawal, was a tiny possible white polyp versus a tiny raised erosion which was cold biopsied x 2.  No other polypoid lesions, massed, or abnormalities were seen as we slowly withdrew back through the anastomosis and into the rectum.  The scope was retroflexed revealing some internal hemorrhoids.  The scope was straightened and  readvanced up the left side of the colon.  Air was suctioned and the scope removed.  The patient tolerated the procedure well. There were no obvious immediate complications.  ENDOSCOPIC DIAGNOSES: 1. Internal and external hemorrhoids. 2. Low anastomosis with some diverticula around the surgical sites. 3. Tiny white polyp versus raised erosion, status post cold biopsied,    approximately 5 cm proximal to the anastomosis. 4. Otherwise within normal limits to the cecum and terminal ileum.  PLAN:  Await pathology but probably recheck colonic screening in three years. Happy to see back p.r.n.  Otherwise return care to Drs. Donnie Coffin and [employer] for the customary yearly rectals, guaiacs, and lab work including liver tests, CBC, CEA.  May want to consider Carafate or Questran use for her chronic postsurgical diarrhea.  As mentioned above, happy to see back sooner p.r.n. DD:  02/07/01 TD:  02/07/01 Job: [id number]

## 2011-02-05 NOTE — Op Note (Signed)
NAMEHOANG, REICH                            ACCOUNT NO.:  [id number]   MEDICAL RECORD NO.:  [id number]                   PATIENT TYPE:  AMB   LOCATION:  ENDO                                 [facility]AC[facility]   PHYSICIAN:  Petra Kuba, M.D.                 DATE OF BIRTH:  20-May-1924   DATE OF PROCEDURE:  02/28/2004  DATE OF DISCHARGE:                                 OPERATIVE REPORT   PROCEDURE:  Colonoscopy with polypectomy.   INDICATIONS FOR PROCEDURE:  A patient with a history of colon cancer due for  repeat screening.  Consent was signed after risks, benefits, methods, and  options were thoroughly discussed multiple times in the past.   MEDICINES USED:  Demerol 40, Versed 4.   DESCRIPTION OF PROCEDURE:  Rectal inspection was pertinent for small  external hemorrhoids. Digital exam was negative. The pediatric video  adjustable colonoscope was inserted and easily advanced around the colon to  the proximal level of the hepatic flexure. At that point, a small polyp was  seen, snared, electrocautery applied and the polyp was suctioned through the  scope and collected in the trap.  We were able to advance into the cecum  which was identified by the appendiceal orifice and the ileocecal valve. In  fact, the scope was inserted a short ways into the terminal ileum which was  normal.  Photo documentation was obtained. The scope was slowly withdrawn.  The prep was adequate. There was some liquid stool that required washing and  suctioning. On slow withdrawal through the colon, the cecum and the  ascending were normal.  We could see the polypectomy site which had a nice  white coagulum and no obvious residual polypoid tissue. The scope was  further withdrawn. The transverse and majority of the left side of the colon  was normal. In the distal sigmoid, a small polyp was seen and we elected to  hot biopsy it and put it in a second container. The anastomosis was seen in  the proximal  rectum. It was a low anterior anastomosis end to end and the  scope was slowly withdrawn back to the rectum. Anal rectal pullthrough and  retroflexion confirmed some small hemorrhoids. The scope was reinserted a  short ways up the left side of the colon, air was suctioned, scope removed.  The patient tolerated the procedure well. There was no obvious or immediate  complications.   ENDOSCOPIC DIAGNOSIS:  1. Internal and external hemorrhoids.  2. Low anterior resection.  3. One left sided diverticula seen not mentioned above.  4. Small left sided distal polyp hot biopsied.  5. Small approximately hepatic flexure polyp status post there.  6. Otherwise within normal limits to the terminal ileum.   PLAN:  Await pathology to determine future colonic screening. Happy to see  back p.r.n. otherwise return care to Dr. Christell Constant and Donnie Coffin for the  customary  health care and screening maintenance.                                               Petra Kuba, M.D.    MEM/MEDQ  D:  02/28/2004  T:  02/28/2004  Job:  [id number]   cc:   Ernestina Penna, M.D.  [address] [place]  [place] [address]  Fax: [phone]   Pierce Crane, M.D.  [address] [facility]  [place]  [place] [address]  Fax: [phone]

## 2011-02-05 NOTE — Procedures (Signed)
[place]. [facility]  Patient:    [name] [name], [name] [name]                         MRN: [id number] Proc. Date: 01/18/00 Adm. Date:  [id number] Attending:  Nelda Marseille CC:         Petra Kuba, M.D.             Monica Becton, M.D.                           Procedure Report  PROCEDURE PERFORMED:  Colonoscopy.  ENDOSCOPIST:  Petra Kuba, M.D.  HISTORY:  Patient with bright red blood per rectum.  Initially not wanting colonoscopy but when the symptoms continued, she has finally consented.  She has been guaiac positive as well per her history from Dr. Christell Constant.  Consent was signed after risks, benefits, methods, and options were thoroughly discussed.  MEDICATIONS USED:  Demerol 40 mg, Versed 4 mg.  DESCRIPTION OF PROCEDURE:  Rectal inspection was pertinent for small external hemorrhoids.  Digital exam was negative.  The video pediatric colonoscope was inserted and easily advanced to the midtransverse.  At that point the scope began to loop, the patient was rolled on her back and left lower quadrant pressure was applied and we were able to advance to the cecum.  No obvious abnormalities were seen on insertion.  The prep was adequate.  There was some liquid stool that required suctioning only.  The cecum was identified by the appendiceal orifice and the ileocecal valve.  In fact the scope was inserted a short ways into the terminal ileum.  The scope was then slowly withdrawn.  The terminal ileum, cecum and ascending were normal.  In the transverse, two tiny polyps were seen and were each hot biopsied x 1 on a setting of 20/20.  The scope was further withdrawn.   No other abnormalities were seen as we slowly withdrew back to the rectum; however, at the rectosigmoid junction just behind the first rectal turn, was a 3 to 4 cm ulcerative mass.  Multiple cold biopsies were obtained.  The scope was further withdrawn back to the rectum. The rectum was normal.   Retroflexion revealed some small internal hemorrhoids. Photodocumentation of the rectum was obtained.  The scope was reinserted a short ways up the sigmoid.  Air was suctioned, scope removed. The patient tolerated the procedure well.  There was no obvious immediate complication.  ENDOSCOPIC DIAGNOSIS: 1. Internal and external hemorrhoids. 2. Ulcerative small mass at the rectosigmoid junction, status post biopsy. 3. Two tiny polyps in the transverse, both hot biopsied. 4. Otherwise within normal limits to the terminal ileum.  PLAN:  Will go ahead and check labs including a liver test and CEA. Considerations of a CT but probably proceed with surgical consultation first. DD:  01/18/00 TD:  01/18/00 Job: [id number]

## 2011-02-05 NOTE — Discharge Summary (Signed)
[place]  Patient:    [name] [name], [name] [name]                         MRN: [id number] Adm. Date:  [id number] Disc. Date: [id number] Attending:  Pierce Crane CC:         Pierce Crane, M.D.  Timothy E. Earlene Plater, M.D.   Discharge Summary  ADMITTING DIAGNOSES: 1. Rectal cancer. 2. Admission for enteritis, failure to thrive and dehydration.  DISCHARGE DIAGNOSES: 1. Rectal cancer. 2. Status post admission for enteritis and dehydration. 3. Status post small bowel resection and subsequent persistent partial small    bowel obstruction. 4. Depression. 5. History of anemia. 6. History of left upper extremity deep vein thrombosis. 7. History of malnutrition and diarrhea. 8. History of elevated liver function test, likely secondary to total    nutrient admixture.  PROCEDURES IN HOSPITAL: 1. Small bowel obstruction May 22, 2000. 2. PICC line insertion May 20, 2000.  HISTORY OF PRESENT ILLNESS:  The patient is a 77 year old woman with a known history of rectal cancer. She was diagnosed with this, this year. She was receiving adjuvant chemotherapy and radiation treatments. She was admitted to the hospital with a one to two history of diarrhea and lower abdominal pain.  PHYSICAL EXAMINATION ON ADMISSION:  An ill-appearing woman. Weight 144. VITAL SIGNS:  Blood pressure 117/59, temperature 97.2, pulse 87, respiratory rate 20.  HOSPITAL COURSE:  On admission to the hospital the patient was noted to have a sodium of 127, potassium 2.7 and she was started on IV fluids. She was initially felt to be dehydrated. She was nauseated and still continued to have diarrhea. The abdomen was somewhat distended. She was seen initially and had an x-ray done which showed some dilated bowel loops. An abdominal series showed a partial small bowel obstruction. An NG tube was inserted with the appropriate drainage. She also had a PICC line inserted on August 31 by  Dr. ___________ for TNA administration. She was followed currently by surgery, but with any NG drainage, her bowel did not improve. She was subsequently taken to the OR on May 22, 2000 for exploratory laparotomy. She was found to have a small bowel obstruction with ischemic radiation noted on pathology. Her postoperative course was somewhat protracted as she continued to have problems with abdominal pain. She had poor oral intake. She required TNA for an extended length of time.  While in the hospital she was noted to have swelling of the left arm. She had a Doppler study which showed left upper extremity DVT and was started on Lovenox for this. She was also given Epogen for anemia. Her abdominal discomfort was managed with a combination of narcotics, as well. An attempt was made to clamp her NG tube and she tolerated this poorly. She was seen in consultation by Dr. Jeanie Sewer for depression and recommendations were made to start Remeron, which she had been on the past. She was seen in consultation, as well, by GI because of elevated liver function test. Their recommendation was TNA. She did have some studies including a HIDA scan, which did not document any obvious evidence of obstruction. She was given some day time passes which she tolerated well. She ultimately improved and began to eat a little bit more. She was restarted on Megace with some improvement in her appetite. Ultimately, TNA was stopped and she was ultimately discharged in a stable condition on June 27, 2000.  LABORATORY AND  ACCESSORY DATA:  White count 3.2, hemoglobin 9.7, platelet count 229,000. Potassium 4, creatinine 0.5.  DISCHARGE MEDICATIONS:  She was sent home on her home medications. Remeron 22.5 mg p.o. q.h.s., Reglan 10 mg p.o. a.c. meals and h.s., Megace 160 p.o. b.i.d., Os-Cal and Prempro.  FOLLOWUP:  She was instructed to return to receive Epogen injection on a weekly basis, 40,000 units q. weekly.  Arrangements were made for her to see Dr. Donnie Coffin in three weeks after discharge.  CONDITION ON DISCHARGE:  She was discharged in stable condition. DD:  07/25/00 TD:  07/25/00 Job: [id number]

## 2011-02-23 ENCOUNTER — Ambulatory Visit: Payer: Medicare Other | Attending: Family Medicine | Admitting: Physical Therapy

## 2011-02-23 DIAGNOSIS — IMO0001 Reserved for inherently not codable concepts without codable children: Secondary | ICD-10-CM | POA: Insufficient documentation

## 2011-02-23 DIAGNOSIS — M6281 Muscle weakness (generalized): Secondary | ICD-10-CM | POA: Insufficient documentation

## 2011-02-23 DIAGNOSIS — R5381 Other malaise: Secondary | ICD-10-CM | POA: Insufficient documentation

## 2011-02-25 ENCOUNTER — Ambulatory Visit: Payer: Medicare Other | Admitting: Physical Therapy

## 2011-03-02 ENCOUNTER — Ambulatory Visit: Payer: Medicare Other | Admitting: Physical Therapy

## 2011-03-04 ENCOUNTER — Ambulatory Visit: Payer: Medicare Other | Admitting: Physical Therapy

## 2011-03-09 ENCOUNTER — Ambulatory Visit: Payer: Medicare Other | Admitting: *Deleted

## 2011-03-12 ENCOUNTER — Ambulatory Visit: Payer: Medicare Other | Admitting: Physical Therapy

## 2011-03-16 ENCOUNTER — Ambulatory Visit: Payer: Medicare Other | Admitting: Physical Therapy

## 2011-03-18 ENCOUNTER — Ambulatory Visit: Payer: Medicare Other | Admitting: *Deleted

## 2011-03-23 ENCOUNTER — Ambulatory Visit: Payer: Medicare Other | Attending: Family Medicine | Admitting: Physical Therapy

## 2011-03-23 DIAGNOSIS — R5381 Other malaise: Secondary | ICD-10-CM | POA: Insufficient documentation

## 2011-03-23 DIAGNOSIS — M6281 Muscle weakness (generalized): Secondary | ICD-10-CM | POA: Insufficient documentation

## 2011-03-23 DIAGNOSIS — IMO0001 Reserved for inherently not codable concepts without codable children: Secondary | ICD-10-CM | POA: Insufficient documentation

## 2011-03-25 ENCOUNTER — Ambulatory Visit: Payer: Medicare Other | Admitting: Physical Therapy

## 2011-03-30 ENCOUNTER — Ambulatory Visit: Payer: Medicare Other | Admitting: *Deleted

## 2011-03-31 ENCOUNTER — Encounter: Payer: Self-pay | Admitting: Physical Therapy

## 2011-04-06 ENCOUNTER — Ambulatory Visit: Payer: Medicare Other | Admitting: Physical Therapy

## 2011-04-08 ENCOUNTER — Ambulatory Visit: Payer: Medicare Other | Admitting: Physical Therapy

## 2011-04-12 ENCOUNTER — Ambulatory Visit: Payer: Medicare Other | Admitting: Physical Therapy

## 2011-04-14 ENCOUNTER — Ambulatory Visit: Payer: Medicare Other | Admitting: Physical Therapy

## 2011-04-20 ENCOUNTER — Ambulatory Visit: Payer: Medicare Other | Attending: Family Medicine | Admitting: Physical Therapy

## 2011-04-20 DIAGNOSIS — R5381 Other malaise: Secondary | ICD-10-CM | POA: Insufficient documentation

## 2011-04-20 DIAGNOSIS — M6281 Muscle weakness (generalized): Secondary | ICD-10-CM | POA: Insufficient documentation

## 2011-04-20 DIAGNOSIS — IMO0001 Reserved for inherently not codable concepts without codable children: Secondary | ICD-10-CM | POA: Insufficient documentation

## 2011-04-22 ENCOUNTER — Ambulatory Visit: Payer: Medicare Other | Attending: Family Medicine | Admitting: Physical Therapy

## 2011-04-22 DIAGNOSIS — R5381 Other malaise: Secondary | ICD-10-CM | POA: Insufficient documentation

## 2011-04-22 DIAGNOSIS — M6281 Muscle weakness (generalized): Secondary | ICD-10-CM | POA: Insufficient documentation

## 2011-04-22 DIAGNOSIS — IMO0001 Reserved for inherently not codable concepts without codable children: Secondary | ICD-10-CM | POA: Insufficient documentation

## 2011-04-26 ENCOUNTER — Ambulatory Visit: Payer: Medicare Other | Admitting: Physical Therapy

## 2011-04-28 ENCOUNTER — Ambulatory Visit: Payer: Medicare Other | Admitting: Physical Therapy

## 2011-05-04 ENCOUNTER — Ambulatory Visit: Payer: Medicare Other | Admitting: Physical Therapy

## 2011-05-06 ENCOUNTER — Ambulatory Visit: Payer: Medicare Other | Admitting: Physical Therapy

## 2011-05-11 ENCOUNTER — Ambulatory Visit: Payer: Medicare Other | Admitting: *Deleted

## 2011-05-13 ENCOUNTER — Ambulatory Visit: Payer: Medicare Other | Admitting: *Deleted

## 2011-05-14 ENCOUNTER — Other Ambulatory Visit: Payer: Self-pay | Admitting: Dermatology

## 2011-05-18 ENCOUNTER — Ambulatory Visit: Payer: Medicare Other | Admitting: Physical Therapy

## 2011-05-20 ENCOUNTER — Ambulatory Visit: Payer: Medicare Other | Admitting: *Deleted

## 2011-05-25 ENCOUNTER — Ambulatory Visit: Payer: Medicare Other | Attending: Family Medicine | Admitting: Physical Therapy

## 2011-05-25 DIAGNOSIS — R5381 Other malaise: Secondary | ICD-10-CM | POA: Insufficient documentation

## 2011-05-25 DIAGNOSIS — M6281 Muscle weakness (generalized): Secondary | ICD-10-CM | POA: Insufficient documentation

## 2011-05-25 DIAGNOSIS — IMO0001 Reserved for inherently not codable concepts without codable children: Secondary | ICD-10-CM | POA: Insufficient documentation

## 2011-05-27 ENCOUNTER — Ambulatory Visit: Payer: Medicare Other | Admitting: Physical Therapy

## 2011-06-10 LAB — BASIC METABOLIC PANEL
Calcium: 9.6
Creatinine, Ser: 0.95
GFR calc non Af Amer: 56 — ABNORMAL LOW
Glucose, Bld: 96
Sodium: 137

## 2011-06-10 LAB — CBC
MCHC: 33.8
MCV: 91
Platelets: 143 — ABNORMAL LOW

## 2011-08-17 ENCOUNTER — Ambulatory Visit: Payer: Medicare Other | Attending: Family Medicine | Admitting: Physical Therapy

## 2011-08-17 DIAGNOSIS — M2560 Stiffness of unspecified joint, not elsewhere classified: Secondary | ICD-10-CM | POA: Insufficient documentation

## 2011-08-17 DIAGNOSIS — M542 Cervicalgia: Secondary | ICD-10-CM | POA: Insufficient documentation

## 2011-08-17 DIAGNOSIS — R293 Abnormal posture: Secondary | ICD-10-CM | POA: Insufficient documentation

## 2011-08-17 DIAGNOSIS — IMO0001 Reserved for inherently not codable concepts without codable children: Secondary | ICD-10-CM | POA: Insufficient documentation

## 2011-08-18 ENCOUNTER — Ambulatory Visit: Payer: Medicare Other | Admitting: Physical Therapy

## 2011-08-24 ENCOUNTER — Ambulatory Visit: Payer: Medicare Other | Attending: Family Medicine | Admitting: Physical Therapy

## 2011-08-24 DIAGNOSIS — IMO0001 Reserved for inherently not codable concepts without codable children: Secondary | ICD-10-CM | POA: Insufficient documentation

## 2011-08-24 DIAGNOSIS — M2560 Stiffness of unspecified joint, not elsewhere classified: Secondary | ICD-10-CM | POA: Insufficient documentation

## 2011-08-24 DIAGNOSIS — M542 Cervicalgia: Secondary | ICD-10-CM | POA: Insufficient documentation

## 2011-08-24 DIAGNOSIS — R293 Abnormal posture: Secondary | ICD-10-CM | POA: Insufficient documentation

## 2011-08-26 ENCOUNTER — Ambulatory Visit: Payer: Medicare Other | Admitting: Physical Therapy

## 2011-08-31 ENCOUNTER — Encounter: Payer: Self-pay | Admitting: Physical Therapy

## 2011-09-02 ENCOUNTER — Ambulatory Visit: Payer: Medicare Other | Admitting: Physical Therapy

## 2011-09-07 ENCOUNTER — Ambulatory Visit: Payer: Medicare Other | Admitting: Physical Therapy

## 2011-09-09 ENCOUNTER — Ambulatory Visit: Payer: Medicare Other | Admitting: Physical Therapy

## 2011-09-16 ENCOUNTER — Ambulatory Visit: Payer: Medicare Other | Admitting: Physical Therapy

## 2011-09-24 ENCOUNTER — Encounter: Payer: Self-pay | Admitting: Physical Therapy

## 2011-09-28 ENCOUNTER — Encounter: Payer: Self-pay | Admitting: Physical Therapy

## 2011-09-30 ENCOUNTER — Ambulatory Visit: Payer: Medicare Other | Attending: Family Medicine | Admitting: Physical Therapy

## 2011-09-30 DIAGNOSIS — M2560 Stiffness of unspecified joint, not elsewhere classified: Secondary | ICD-10-CM | POA: Insufficient documentation

## 2011-09-30 DIAGNOSIS — R293 Abnormal posture: Secondary | ICD-10-CM | POA: Insufficient documentation

## 2011-09-30 DIAGNOSIS — IMO0001 Reserved for inherently not codable concepts without codable children: Secondary | ICD-10-CM | POA: Insufficient documentation

## 2011-09-30 DIAGNOSIS — M542 Cervicalgia: Secondary | ICD-10-CM | POA: Insufficient documentation

## 2012-05-20 ENCOUNTER — Encounter (HOSPITAL_COMMUNITY): Payer: Self-pay | Admitting: *Deleted

## 2012-05-20 ENCOUNTER — Emergency Department (HOSPITAL_COMMUNITY)
Admission: EM | Admit: 2012-05-20 | Discharge: 2012-05-21 | Disposition: A | Discharge status: Home / Self Care | Payer: Medicare Other | Attending: Emergency Medicine | Admitting: Emergency Medicine

## 2012-05-20 DIAGNOSIS — I2510 Atherosclerotic heart disease of native coronary artery without angina pectoris: Secondary | ICD-10-CM | POA: Insufficient documentation

## 2012-05-20 DIAGNOSIS — S61419A Laceration without foreign body of unspecified hand, initial encounter: Secondary | ICD-10-CM

## 2012-05-20 DIAGNOSIS — S61409A Unspecified open wound of unspecified hand, initial encounter: Secondary | ICD-10-CM | POA: Insufficient documentation

## 2012-05-20 DIAGNOSIS — W269XXA Contact with unspecified sharp object(s), initial encounter: Secondary | ICD-10-CM | POA: Insufficient documentation

## 2012-05-20 DIAGNOSIS — I10 Essential (primary) hypertension: Secondary | ICD-10-CM | POA: Insufficient documentation

## 2012-05-20 DIAGNOSIS — W01119A Fall on same level from slipping, tripping and stumbling with subsequent striking against unspecified sharp object, initial encounter: Secondary | ICD-10-CM | POA: Insufficient documentation

## 2012-05-20 NOTE — ED Notes (Signed)
Pt fell while walking and looking at a magazine. Pt denies other injury besides L hand laceration, 4-5 inches, posterior. Bleeding controlled at this time.

## 2012-05-21 NOTE — ED Provider Notes (Signed)
History     CSN: [id number]  Arrival date & time 05/20/12  2154   First MD Initiated Contact with Patient 05/21/12 0403      Chief Complaint  Patient presents with  . Extremity Laceration    (Consider location/radiation/quality/duration/timing/severity/associated sxs/prior treatment) HPI The patient presents with a laceration of her left hand.  Just prior to arrival the patient fell against a hand, especially in her hand.  She denies pain, but has had an actively bleeding wound since the event.  No other contact, no loss of consciousness, no other complaints. Past Medical History  Diagnosis Date  . Cancer   . Arrhythmia   . Coronary artery disease   . Hypertension     Past Surgical History  Procedure Date  . Bowel resection   . Colon cancer   . Appendectomy     Family History  Problem Relation Age of Onset  . Heart disease Mother   . Heart disease Sister   . Heart disease Brother     History  Substance Use Topics  . Smoking status: Never Smoker   . Smokeless tobacco: Not on file  . Alcohol Use: No    OB History    Grav Para Term Preterm Abortions TAB SAB Ect Mult Living                  Review of Systems  All other systems reviewed and are negative.    Allergies  Celebrex; Meprobamate; and Rofecoxib  Home Medications   Current Outpatient Rx  Name Route Sig Dispense Refill  . ASPIRIN EC 325 MG PO TBEC Oral Take 1 tablet (325 mg total) by mouth. 30 tablet 0  . ALIGN PO CAPS Oral Take 1 capsule by mouth daily.    Marland Kitchen BIOTIN PO Oral Take 1 tablet by mouth daily.    Marland Kitchen CALCIUM CARB-CHOLECALCIFEROL 500-400 MG-UNIT PO TABS Oral Take 1 tablet by mouth 2 (two) times daily.    . CYANOCOBALAMIN 1000 MCG/ML IJ SOLN Intramuscular Inject 1,000 mcg into the muscle every 30 (thirty) days.    Di Kindle SULFATE 325 (65 FE) MG PO TABS Oral Take 325 mg by mouth daily with breakfast.    . GLUCOSAMINE PO Oral Take 1 tablet by mouth daily.    . ISOSORBIDE MONONITRATE ER  30 MG PO TB24 Oral Take 1 tablet (30 mg total) by mouth daily. 30 tablet 11  . LOPERAMIDE HCL 2 MG PO TABS Oral Take 6 mg by mouth 4 (four) times daily. "Short bowel" syndrome. 30 tablet 0  . MEDIUM CHAIN TRIGLYCERIDES PO OIL Oral Take 15 mLs by mouth 3 (three) times daily. Mix in glass with liquid.   For diarrhea and digestion help.    Marland Kitchen METOPROLOL SUCCINATE ER 25 MG PO TB24 Oral Take 12.5 mg by mouth daily. Take 1/2 tablet daily 30 tablet 11  . ADULT MULTIVITAMIN W/MINERALS CH Oral Take 1 tablet by mouth daily.    Marland Kitchen PANTOPRAZOLE SODIUM 40 MG PO TBEC Oral Take 1 tablet (40 mg total) by mouth daily. 30 tablet 11  . SOLIFENACIN SUCCINATE 10 MG PO TABS Oral Take 10 mg by mouth daily.      BP 162/94  Pulse 56  Temp 98.1 F (36.7 C) (Oral)  Resp 20  SpO2 98%  Physical Exam  Vitals reviewed. Constitutional: She appears well-developed and well-nourished. No distress.  HENT:  Head: Normocephalic and atraumatic.  Eyes: Conjunctivae are normal. Right eye exhibits no discharge.  Cardiovascular: Normal  rate and regular rhythm.   Murmur heard. Pulmonary/Chest: Effort normal. No stridor. No respiratory distress.  Musculoskeletal:       Arms:      The patient can flex and extend all digits of the hand, the wrist is appropriate range of motion, strength, and distal cap refill and pulses are appropriate  Skin: She is not diaphoretic.    ED Course  LACERATION REPAIR Date/Time: 05/21/2012 7:09 AM Performed by: Gerhard Munch Authorized by: Gerhard Munch Consent: Verbal consent obtained. Written consent not obtained. Risks and benefits: risks, benefits and alternatives were discussed Consent given by: patient Patient understanding: patient states understanding of the procedure being performed Patient identity confirmed: verbally with patient Time out: Immediately prior to procedure a "time out" was called to verify the correct patient, procedure, equipment, support staff and site/side marked  as required. Body area: upper extremity Location details: left hand Laceration length: 12 cm Tendon involvement: none Nerve involvement: none Vascular damage: yes Local anesthetic: lidocaine 1% with epinephrine Anesthetic total: 6 ml Patient sedated: no Preparation: Patient was prepped and draped in the usual sterile fashion. Irrigation solution: saline Irrigation method: syringe Amount of cleaning: standard Debridement: none Degree of undermining: none Skin closure: 6-0 nylon Number of sutures: 12 Technique: complex and simple Approximation: loose Approximation difficulty: complex Dressing: antibiotic ointment, gauze roll and 4x4 sterile gauze Patient tolerance: Patient tolerated the procedure well with no immediate complications.   (including critical care time)  Labs Reviewed - No data to display No results found.   1. Hand laceration       MDM  This elderly female presents with an open wound on the left hand.  The passage of time since the wound, as well the wound edges to be widely separated, though they were approximated as much as possible during suture repair.  Given the location the wound is not amenable to other closure devices.  The patient had no complaints, was discharged in stable condition after a prolonged discussion on wound care, follow up instructions.     Gerhard Munch, MD 05/21/12 [phone]

## 2012-09-10 IMAGING — CT CT ABD-PELV W/O CM
1 of 2 series · 12 of 32 positions shown, 17 images · non-contrast
Comparison: Abdominal radiographs 09/21/2010 and CT abdomen pelvis
01/30/2007

CLINICAL DATA: Abdominal pain and vomiting.

CT ABDOMEN AND PELVIS WITHOUT CONTRAST
TECHNIQUE: Multidetector CT imaging of the abdomen and pelvis was
performed following the standard protocol without intravenous
contrast.

[Series 2: rtn ap without · axial · non-contrast · 0.62mm/px · z∈[-543,-138]mm · 12 of 95 slices shown, 17 images]
[im 7/95  soft-tissue]
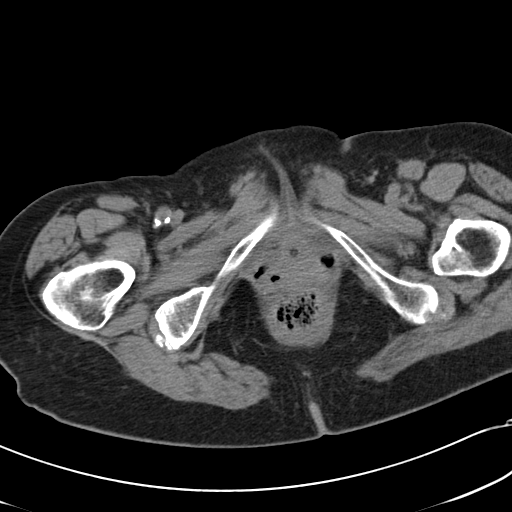
[im 7/95  bone]
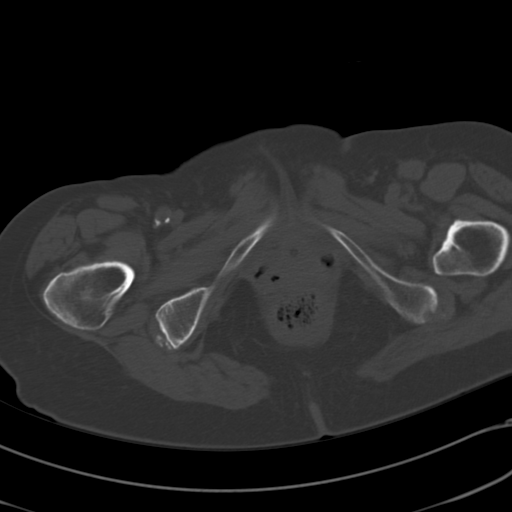
[im 13/95  soft-tissue]
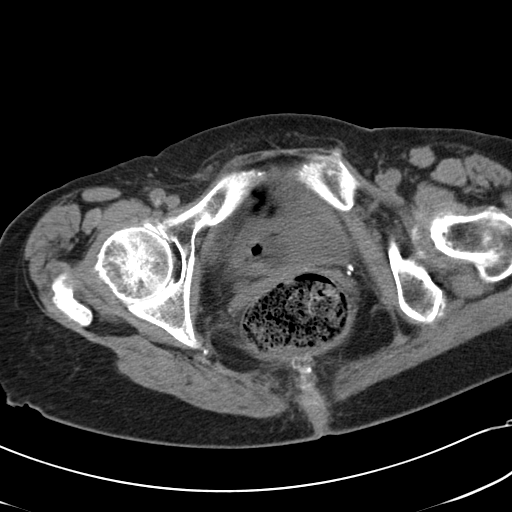
[im 26/95  soft-tissue]
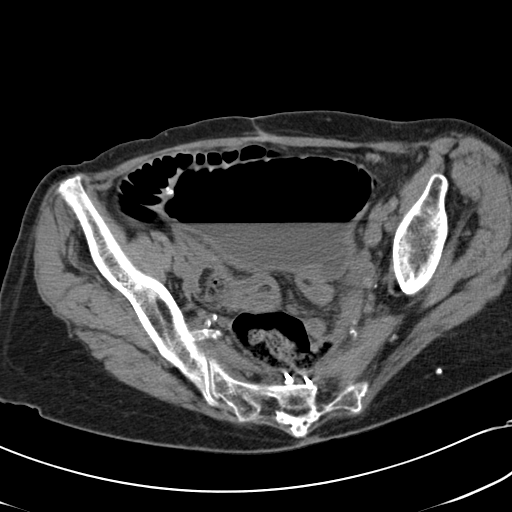
[im 32/95  soft-tissue]
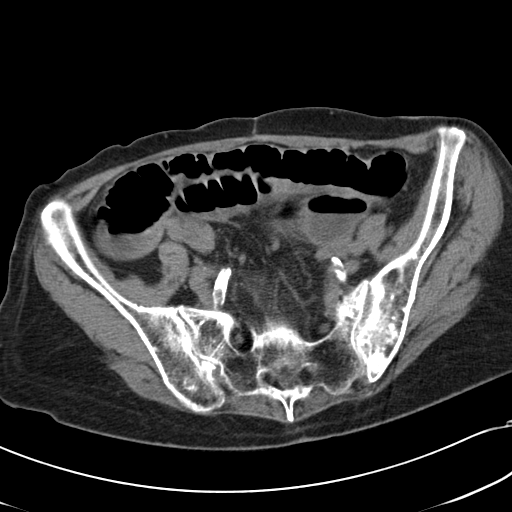
[im 38/95  soft-tissue]
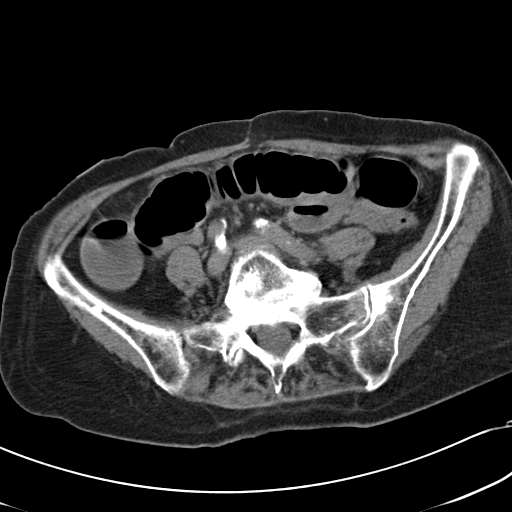
[im 51/95  soft-tissue]
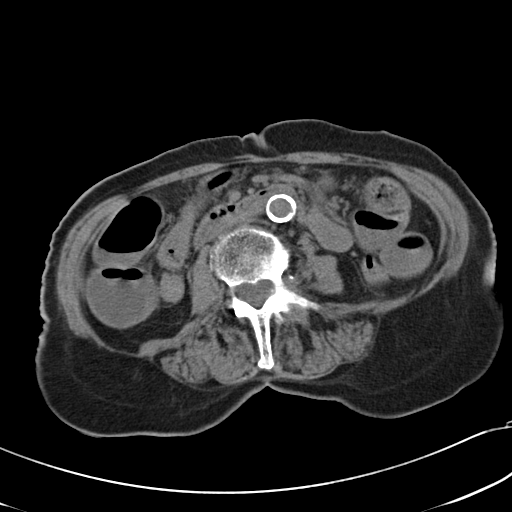
[im 57/95  soft-tissue]
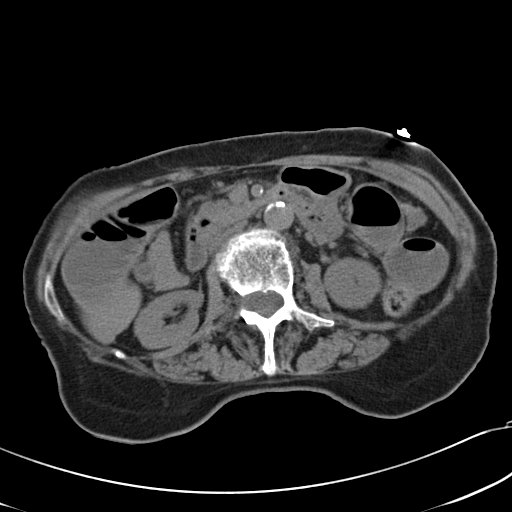
[im 63/95  soft-tissue]
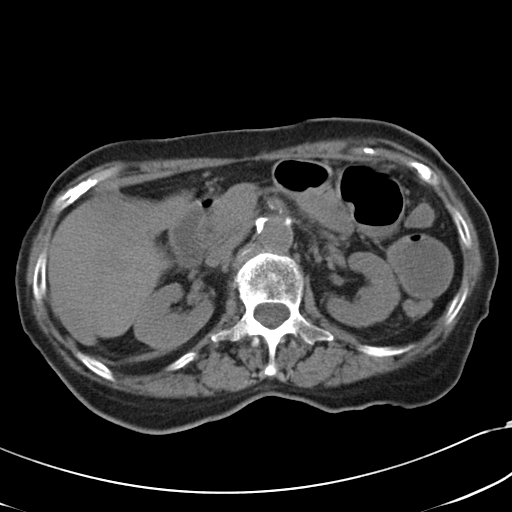
[im 69/95  soft-tissue]
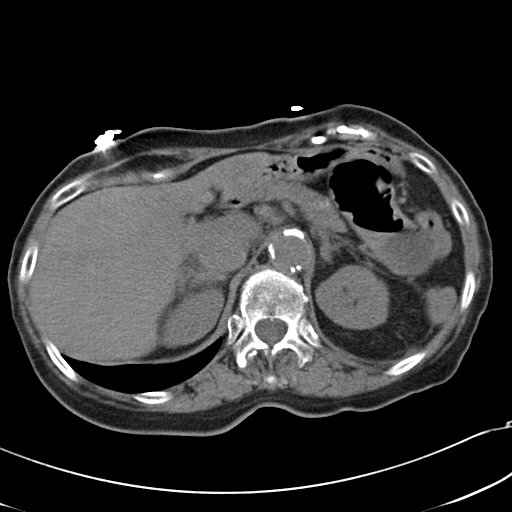
[im 69/95  lung]
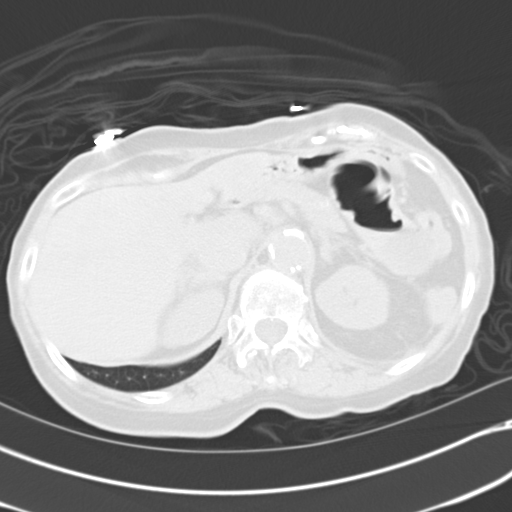
[im 69/95  bone]
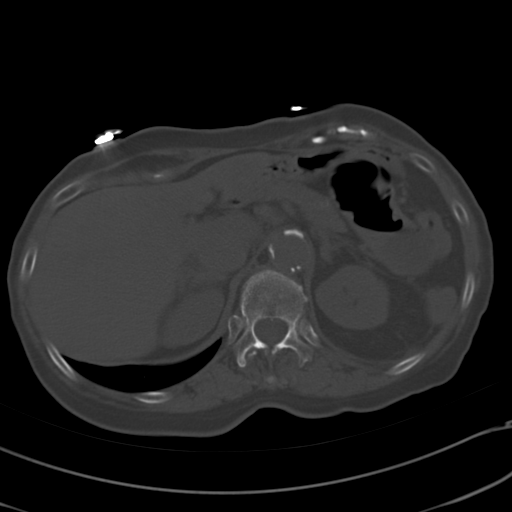
[im 76/95  lung]
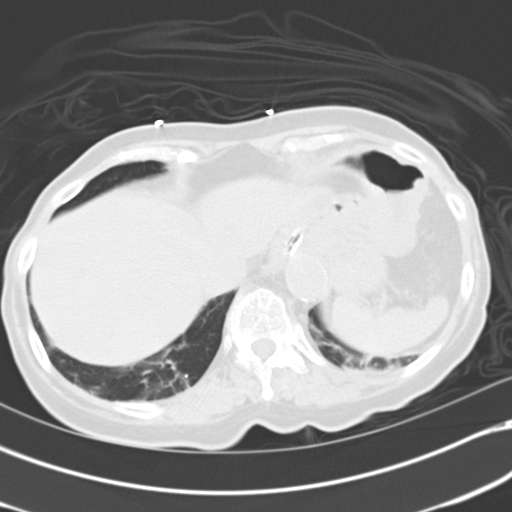
[im 82/95  soft-tissue]
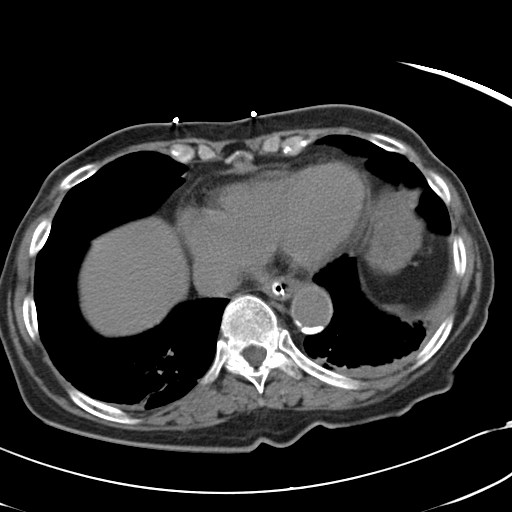
[im 82/95  lung]
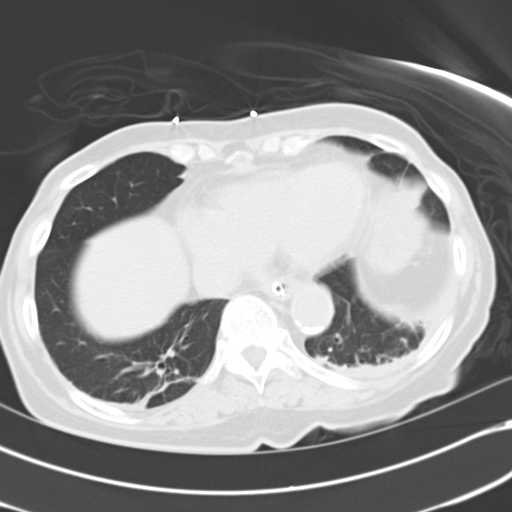
[im 88/95  soft-tissue]
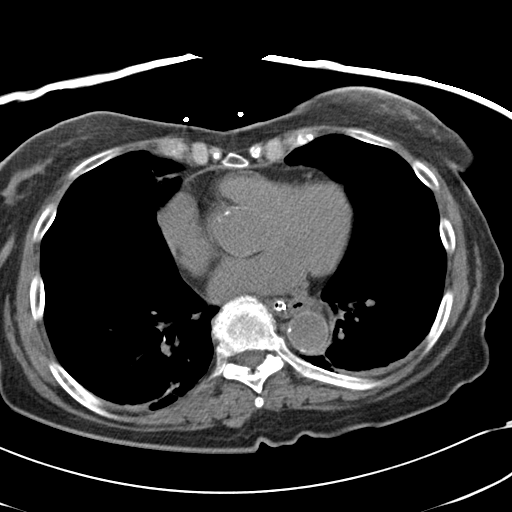
[im 88/95  lung]
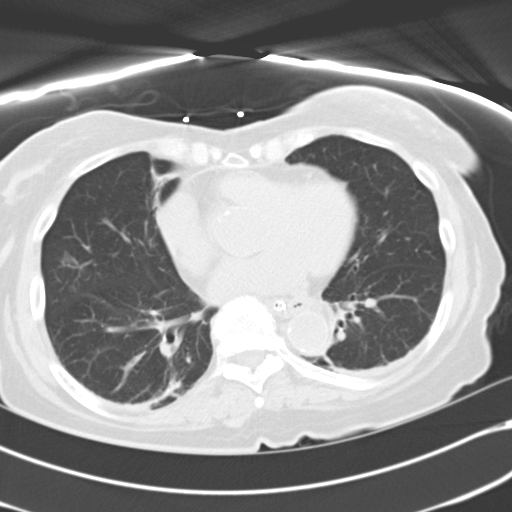

[12 of 32 positions shown; findings below may reference images not displayed]

FINDINGS: There is calcified granuloma in the right middle lobe.
Extensive bibasilar atelectasis, left greater than right, with
bibasilar bronchiectasis noted.  Coronary artery atherosclerosis is
present.

A nasogastric tube terminates in the proximal stomach.  The stomach
is decompressed.

The duodenum is decompressed.  There are dilated loops of jejunum
and ileum that contain air-fluid levels. Abdominal small bowel
loops measure up to 4.2 cm in the right lower quadrant.

In the midline pelvis anteriorly is a markedly dilated small bowel
loop with an air-fluid level.  This bowel loop measures up to
cm AP diameter.  On coronal image number 22 it can be seen to
connect to a bowel loop in the left aspect of the pelvis which is
dilated, but not to the same degree.  Along the right aspect of the
markedly dilated loop of small bowel is some high-density material
that may reflect surgical suture (image #70 through 72 of series
2).  Along the posterior left aspect of the markedly dilated small
bowel loop is a completely decompressed small bowel loop (image #69
of the axial images and image #55 of the sagittal reformats (.
Beyond this point, the distal and terminal ileum small bowel loops
are completely decompressed.  The ileocecal valve can be visualized
on image number 70 in the right pelvis, and the cecum and ascending
colon are nearly completely decompressed.  There is some stool in
the transverse colon.  The rectum is distended with a prominent
amount of stool, measuring 5 cm AP diameter.

Urinary bladder is decompressed by a Foley catheter.  Multiple
surgical  clips are present in the pelvis posteriorly, and
reportedly the patient has a history of rectal cancer.

Noncontrast appearance of the spleen, adrenal glands, and kidneys
is within normal limits.  The gallbladder is at the nearly
completely decompressed, or possibly surgically absent.  Suggest
clinical correlation.  There is a vague area of decreased
attenuation in the right lobe of the liver (images number 29 and
30), of uncertain significance.

No free intraperitoneal air is present.  No evidence of abscess.

Heavy atherosclerotic calcification of the aortoiliac and proximal
femoral artery vasculature is noted.

There is no adnexal mass or abdominopelvic lymphadenopathy.

Patchy area of sclerosis in the left iliac bone, adjacent to the
sacroiliac joint, is stable.  There is grade 1 anterolisthesis of
L4 on L5.  Vertebral bodies are normal in height.
IMPRESSION: 1. High-grade small bowel obstruction, with an apparent transition
point in the lower left pelvis.  There is a markedly asymmetrically
prominent anterior low pelvis small bowel loop with an air-fluid
level that measures up to 7.6 cm.  There is surgical suture along
to the right aspect of this loop.  An element of a closed loop
obstruction in the pelvis is not excluded.  This marked dilatation
does place the patient at risk for perforation. Critical test
results telephoned to Dr. Sorrell at the time of interpretation on
09/21/2010 at 3 o'clock p.m.
2.  Vague area of decreased attenuation in the right lobe of the
liver is nonspecific.  A focal liver lesion cannot be excluded on
noncontrast imaging.
3.  Stable area of sclerosis in the left iliac bone.  Findings  are
nonspecific, but  insufficiency fracture or metastatic disease
cannot be excluded.                                4.  Prominent
amount of stool in the rectum.

## 2012-09-12 IMAGING — CR DG CHEST 1V PORT
1 series · 1 of 1 positions shown · non-contrast
Comparison: Yesterday

CLINICAL DATA: Shock

PORTABLE CHEST - 1 VIEW

[AP]
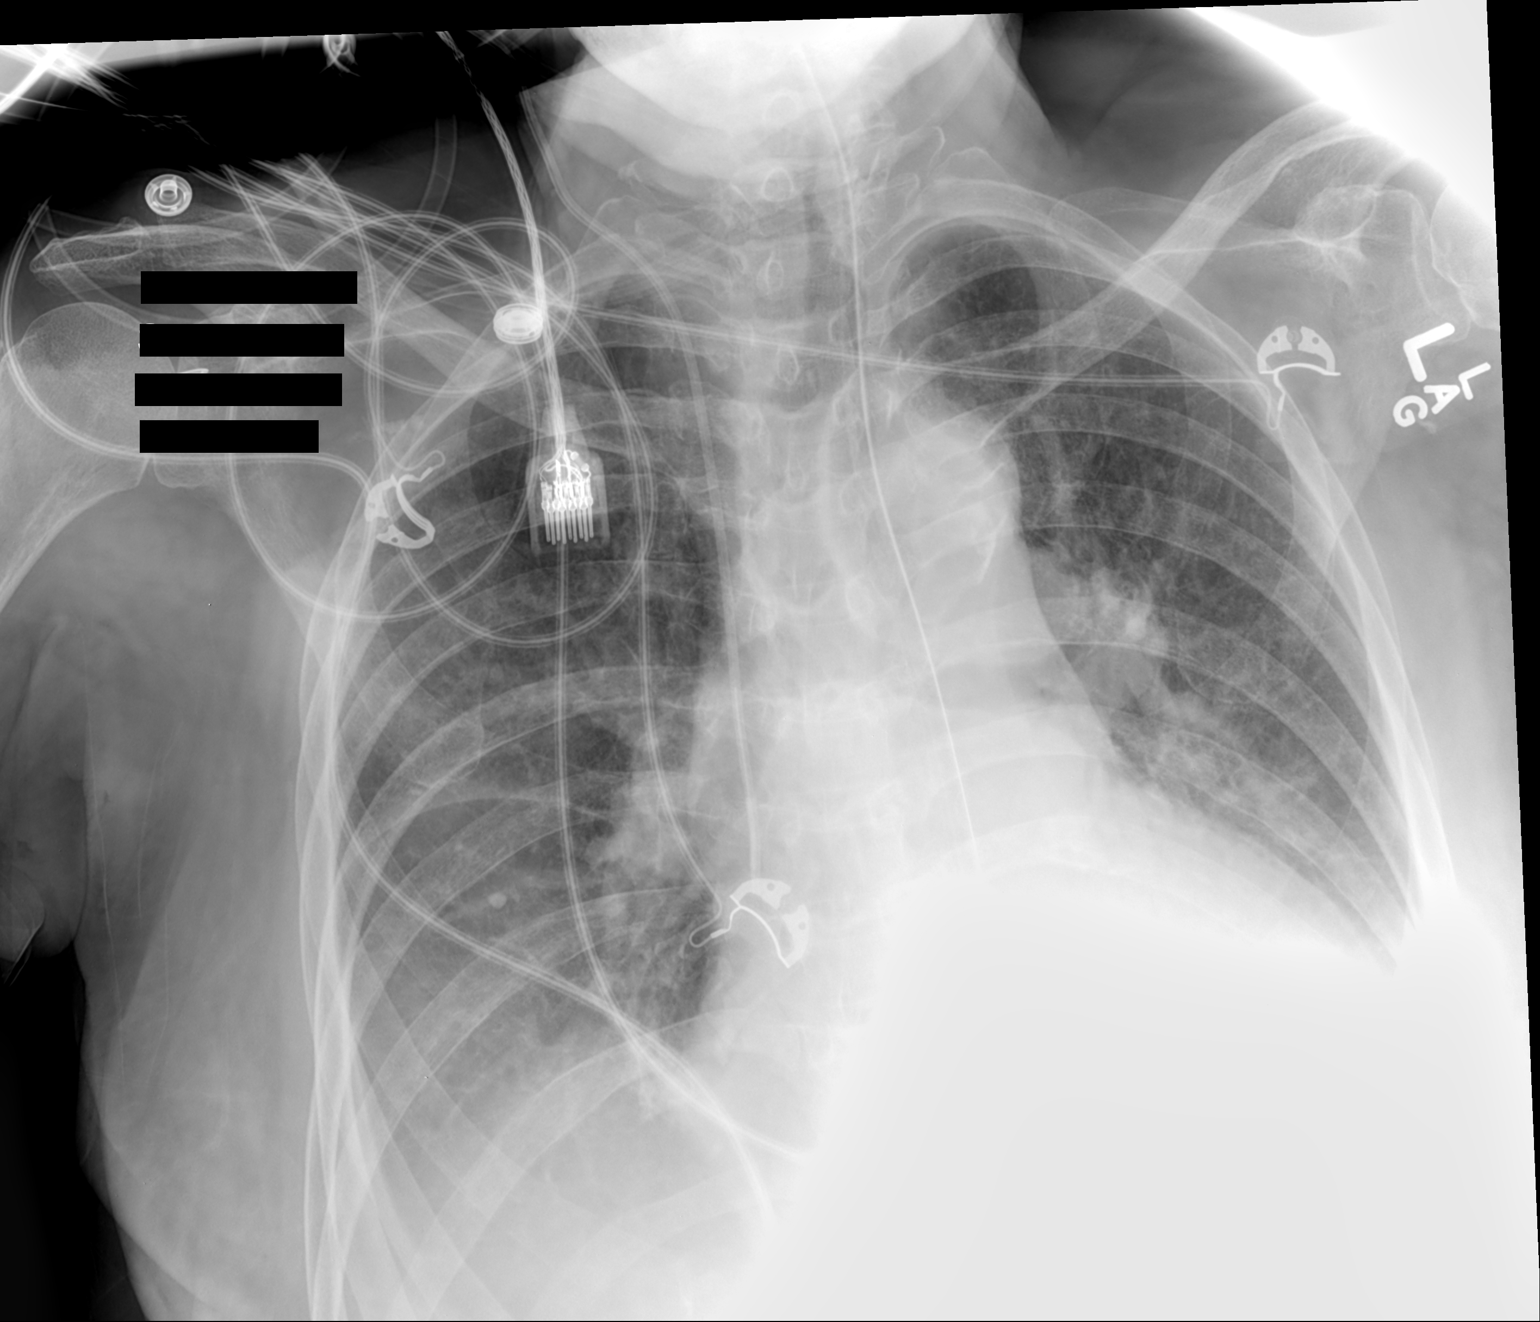

[1 of 1 positions shown; findings below may reference images not displayed]

FINDINGS: Stable low volumes and cardiomegaly.  Hemidiaphragms are
more obscured today worrisome for developing pleural effusions or
basilar volume loss.  Stable NG tube and right internal jugular
vein center venous catheter.
IMPRESSION: Hemidiaphragms are becoming obscured suggesting development of
pleural fluid or increasing bibasilar volume loss.

## 2012-12-14 ENCOUNTER — Ambulatory Visit (INDEPENDENT_AMBULATORY_CARE_PROVIDER_SITE_OTHER): Payer: Medicare Other | Admitting: *Deleted

## 2012-12-14 DIAGNOSIS — E538 Deficiency of other specified B group vitamins: Secondary | ICD-10-CM

## 2012-12-14 MED ORDER — CYANOCOBALAMIN 1000 MCG/ML IJ SOLN
1000.0000 ug | Freq: Once | INTRAMUSCULAR | Status: AC
  Administered 2012-12-14: 1000 ug via INTRAMUSCULAR

## 2012-12-14 NOTE — Progress Notes (Signed)
Tolerated well

## 2013-01-04 ENCOUNTER — Other Ambulatory Visit (INDEPENDENT_AMBULATORY_CARE_PROVIDER_SITE_OTHER): Payer: Medicare Other

## 2013-01-04 DIAGNOSIS — Z1212 Encounter for screening for malignant neoplasm of rectum: Secondary | ICD-10-CM

## 2013-01-04 NOTE — Progress Notes (Unsigned)
Patient dropped off FOBT only

## 2013-01-05 LAB — FECAL OCCULT BLOOD, IMMUNOCHEMICAL: Fecal Occult Blood: POSITIVE — AB

## 2013-01-18 ENCOUNTER — Other Ambulatory Visit: Payer: Self-pay | Admitting: *Deleted

## 2013-01-18 ENCOUNTER — Other Ambulatory Visit (INDEPENDENT_AMBULATORY_CARE_PROVIDER_SITE_OTHER): Payer: Medicare Other

## 2013-01-18 ENCOUNTER — Ambulatory Visit: Payer: Medicare Other | Admitting: *Deleted

## 2013-01-18 DIAGNOSIS — E538 Deficiency of other specified B group vitamins: Secondary | ICD-10-CM

## 2013-01-18 DIAGNOSIS — R195 Other fecal abnormalities: Secondary | ICD-10-CM

## 2013-01-18 LAB — CBC WITH DIFFERENTIAL/PLATELET
Basophils Absolute: 0 10*3/uL (ref 0.0–0.1)
Basophils Relative: 1 % (ref 0–1)
Eosinophils Absolute: 0.1 10*3/uL (ref 0.0–0.7)
MCHC: 33.9 g/dL (ref 30.0–36.0)
Monocytes Absolute: 0.3 10*3/uL (ref 0.1–1.0)
Neutro Abs: 3.2 10*3/uL (ref 1.7–7.7)
Neutrophils Relative %: 70 % (ref 43–77)
RDW: 14.4 % (ref 11.5–15.5)

## 2013-01-18 MED ORDER — CYANOCOBALAMIN 1000 MCG/ML IJ SOLN
1000.0000 ug | Freq: Once | INTRAMUSCULAR | Status: AC
  Administered 2013-01-18: 1000 ug via INTRAMUSCULAR

## 2013-01-18 NOTE — Progress Notes (Unsigned)
Pt here today for labs only 

## 2013-01-25 ENCOUNTER — Other Ambulatory Visit (INDEPENDENT_AMBULATORY_CARE_PROVIDER_SITE_OTHER): Payer: Medicare Other

## 2013-01-25 DIAGNOSIS — Z1212 Encounter for screening for malignant neoplasm of rectum: Secondary | ICD-10-CM

## 2013-01-25 NOTE — Progress Notes (Unsigned)
Patient dropped off fobt 

## 2013-02-16 ENCOUNTER — Encounter: Payer: Medicare Other | Admitting: *Deleted

## 2013-02-16 MED ORDER — CYANOCOBALAMIN 1000 MCG/ML IJ SOLN
1000.0000 ug | Freq: Once | INTRAMUSCULAR | Status: AC
  Administered 2013-02-20: 1000 ug via INTRAMUSCULAR

## 2013-02-19 NOTE — Progress Notes (Signed)
ERROR IN ENTRY PT TOO EARLY FOR B12 TODAY

## 2013-02-20 ENCOUNTER — Encounter: Payer: Self-pay | Admitting: Family Medicine

## 2013-02-20 ENCOUNTER — Telehealth: Payer: Self-pay | Admitting: Family Medicine

## 2013-02-20 ENCOUNTER — Encounter: Payer: Self-pay | Admitting: *Deleted

## 2013-02-20 ENCOUNTER — Ambulatory Visit (INDEPENDENT_AMBULATORY_CARE_PROVIDER_SITE_OTHER): Payer: Medicare Other | Admitting: Family Medicine

## 2013-02-20 ENCOUNTER — Ambulatory Visit: Payer: Medicare Other

## 2013-02-20 VITALS — BP 162/78 | HR 71 | Temp 97.4°F | Ht 63.0 in | Wt 113.4 lb

## 2013-02-20 DIAGNOSIS — D649 Anemia, unspecified: Secondary | ICD-10-CM

## 2013-02-20 DIAGNOSIS — L03119 Cellulitis of unspecified part of limb: Secondary | ICD-10-CM

## 2013-02-20 DIAGNOSIS — E538 Deficiency of other specified B group vitamins: Secondary | ICD-10-CM

## 2013-02-20 DIAGNOSIS — IMO0002 Reserved for concepts with insufficient information to code with codable children: Secondary | ICD-10-CM

## 2013-02-20 DIAGNOSIS — S90862A Insect bite (nonvenomous), left foot, initial encounter: Secondary | ICD-10-CM

## 2013-02-20 DIAGNOSIS — L03116 Cellulitis of left lower limb: Secondary | ICD-10-CM

## 2013-02-20 MED ORDER — DOXYCYCLINE HYCLATE 100 MG PO TABS: 100.0000 mg | ORAL_TABLET | Freq: Two times a day (BID) | ORAL | Status: DC

## 2013-02-20 NOTE — Telephone Encounter (Signed)
appt made

## 2013-02-20 NOTE — Patient Instructions (Addendum)
Wood Tick Bite Ticks are insects that attach themselves to the skin. Most tick bites are harmless, but sometimes ticks carry diseases that can make a person quite ill. The chance of getting ill depends on:  The kind of tick that bites you.  Time of year.  How long the tick is attached.  Geographic location. Wood ticks are also called dog ticks. They are generally black. They can have white markings. They live in shrubs and grassy areas. They are larger than deer ticks. Wood ticks are about the size of a watermelon seed. They have a hard body. The most common places for ticks to attach themselves are the scalp, neck, armpits, waist, and groin. Wood ticks may stay attached for up to 2 weeks. TICKS MUST BE REMOVED AS SOON AS POSSIBLE TO HELP PREVENT DISEASES CAUSED BY TICK BITES.  TO REMOVE A TICK: 1. If available, put on latex gloves before trying to remove a tick. 2. Grasp the tick as close to the skin as possible, with curved forceps, fine tweezers or a special tick removal tool. 3. Pull gently with steady pressure until the tick lets go. Do not twist the tick or jerk it suddenly. This may break off the tick's head or mouth parts. 4. Do not crush the tick's body. This could force disease-carrying fluids from the tick into your body. 5. After the tick is removed, wash the bite area and your hands with soap and water or other disinfectant. 6. Apply a small amount of antiseptic cream or ointment to the bite site. 7. Wash and disinfect any instruments that were used. 8. Save the tick in a jar or plastic bag for later identification. Preserve the tick with a bit of alcohol or put it in the freezer. 9. Do not apply a hot match, petroleum jelly, or fingernail polish to the tick. This does not work and may increase the chances of disease from the tick bite. YOU MAY NEED TO SEE YOUR CAREGIVER FOR A TETANUS SHOT NOW IF:  You have no idea when you had the last one.  You have never had a tetanus shot  before. If you need a tetanus shot, and you decide not to get one, there is a rare chance of getting tetanus. Sickness from tetanus can be serious. If you get a tetanus shot, your arm may swell, get red and warm to the touch at the shot site. This is common and not a problem. PREVENTION  Wear protective clothing. Long sleeves and pants are best.  Wear white clothes to see ticks more easily  Tuck your pant legs into your socks.  If walking on trail, stay in the middle of the trail to avoid brushing against bushes.  Put insect repellent on all exposed skin and along boot tops, pant legs and sleeve cuffs  Check clothing, hair and skin repeatedly and before coming inside.  Brush off any ticks that are not attached. SEEK MEDICAL CARE IF:   You cannot remove a tick or part of the tick that is left in the skin.  Unexplained fever.  Redness and swelling in the area of the tick bite.  Tender, swollen lymph glands.  Diarrhea.  Weight loss.  Cough.  Fatigue.  Muscle, joint or bone pain.  Belly pain.  Headache.  Rash. SEEK IMMEDIATE MEDICAL CARE IF:   You develop an oral temperature above 102 F (38.9 C).  You are having trouble walking or moving your legs.  Numbness in the legs.    pain.   Belly pain.   Headache.   Rash.  SEEK IMMEDIATE MEDICAL CARE IF:    You develop an oral temperature above 102 F (38.9 C).   You are having trouble walking or moving your legs.   Numbness in the legs.   Shortness of breath.   Confusion.   Repeated vomiting.  Document Released: 09/03/2000 Document Revised: 11/29/2011 Document Reviewed: 08/12/2008  [employer] Patient Information 2014 [employer].

## 2013-02-20 NOTE — Progress Notes (Signed)
Subjective:    Patient ID: [name] [name], female    DOB: [birth year], 79 y.o.   MRN: [id number]  HPI Tick was removed from below the medial malleolus 3 days ago. Subsequent to that 2 blisters have developed one larger one smaller. The blisters developed in a separate area from the tick bite itself.   Review of Systems  Skin: Positive for wound (blisters L ankle).       Objective:   Physical Exam 2 large blisters below the left medial malleolus. The blister is larger than the other.       Assessment & Plan:  1. Tick bite of left foot - doxycycline (VIBRA-TABS) 100 MG tablet; Take 1 tablet (100 mg total) by mouth 2 (two) times daily.  Dispense: 20 tablet; Refill: 0  2. Cellulitis of left ankle - doxycycline (VIBRA-TABS) 100 MG tablet; Take 1 tablet (100 mg total) by mouth 2 (two) times daily.  Dispense: 20 tablet; Refill: 0   Patient Instructions  Wood Tick Bite Ticks are insects that attach themselves to the skin. Most tick bites are harmless, but sometimes ticks carry diseases that can make a person quite ill. The chance of getting ill depends on:  The kind of tick that bites you.  Time of year.  How long the tick is attached.  Geographic location. Wood ticks are also called dog ticks. They are generally black. They can have white markings. They live in shrubs and grassy areas. They are larger than deer ticks. Wood ticks are about the size of a watermelon seed. They have a hard body. The most common places for ticks to attach themselves are the scalp, neck, armpits, waist, and groin. Wood ticks may stay attached for up to 2 weeks. TICKS MUST BE REMOVED AS SOON AS POSSIBLE TO HELP PREVENT DISEASES CAUSED BY TICK BITES.  TO REMOVE A TICK: 1. If available, put on latex gloves before trying to remove a tick. 2. Grasp the tick as close to the skin as possible, with curved forceps, fine tweezers or a special tick removal tool. 3. Pull gently with steady pressure until the tick  lets go. Do not twist the tick or jerk it suddenly. This may break off the tick's head or mouth parts. 4. Do not crush the tick's body. This could force disease-carrying fluids from the tick into your body. 5. After the tick is removed, wash the bite area and your hands with soap and water or other disinfectant. 6. Apply a small amount of antiseptic cream or ointment to the bite site. 7. Wash and disinfect any instruments that were used. 8. Save the tick in a jar or plastic bag for later identification. Preserve the tick with a bit of alcohol or put it in the freezer. 9. Do not apply a hot match, petroleum jelly, or fingernail polish to the tick. This does not work and may increase the chances of disease from the tick bite. YOU MAY NEED TO SEE YOUR CAREGIVER FOR A TETANUS SHOT NOW IF:  You have no idea when you had the last one.  You have never had a tetanus shot before. If you need a tetanus shot, and you decide not to get one, there is a rare chance of getting tetanus. Sickness from tetanus can be serious. If you get a tetanus shot, your arm may swell, get red and warm to the touch at the shot site. This is common and not a problem. PREVENTION  Wear protective clothing. Long sleeves  and pants are best.  Wear white clothes to see ticks more easily  Tuck your pant legs into your socks.  If walking on trail, stay in the middle of the trail to avoid brushing against bushes.  Put insect repellent on all exposed skin and along boot tops, pant legs and sleeve cuffs  Check clothing, hair and skin repeatedly and before coming inside.  Brush off any ticks that are not attached. SEEK MEDICAL CARE IF:   You cannot remove a tick or part of the tick that is left in the skin.  Unexplained fever.  Redness and swelling in the area of the tick bite.  Tender, swollen lymph glands.  Diarrhea.  Weight loss.  Cough.  Fatigue.  Muscle, joint or bone pain.  Belly  pain.  Headache.  Rash. SEEK IMMEDIATE MEDICAL CARE IF:   You develop an oral temperature above 102 F (38.9 C).  You are having trouble walking or moving your legs.  Numbness in the legs.  Shortness of breath.  Confusion.  Repeated vomiting. Document Released: 09/03/2000 Document Revised: 11/29/2011 Document Reviewed: 08/12/2008 [facility] Patient Information 2014 [place], [place].

## 2013-02-22 NOTE — Telephone Encounter (Signed)
Patient was seen 02/20/13 in the evening clinic

## 2013-03-15 ENCOUNTER — Ambulatory Visit: Payer: BLUE CROSS/BLUE SHIELD | Admitting: Family Medicine

## 2013-03-21 ENCOUNTER — Ambulatory Visit (INDEPENDENT_AMBULATORY_CARE_PROVIDER_SITE_OTHER): Payer: Medicare Other | Admitting: Family Medicine

## 2013-03-21 ENCOUNTER — Encounter: Payer: Self-pay | Admitting: Family Medicine

## 2013-03-21 VITALS — BP 174/82 | HR 55 | Temp 98.7°F | Ht 63.0 in | Wt 116.2 lb

## 2013-03-21 DIAGNOSIS — I10 Essential (primary) hypertension: Secondary | ICD-10-CM

## 2013-03-21 DIAGNOSIS — R5383 Other fatigue: Secondary | ICD-10-CM

## 2013-03-21 DIAGNOSIS — E559 Vitamin D deficiency, unspecified: Secondary | ICD-10-CM

## 2013-03-21 DIAGNOSIS — E785 Hyperlipidemia, unspecified: Secondary | ICD-10-CM

## 2013-03-21 LAB — POCT CBC
Granulocyte percent: 62.8 %G (ref 37–80)
HCT, POC: 38.1 % (ref 37.7–47.9)
Hemoglobin: 13 g/dL (ref 12.2–16.2)
MCV: 90.2 fL (ref 80–97)
RDW, POC: 14.2 %
WBC: 4.7 10*3/uL (ref 4.6–10.2)

## 2013-03-21 LAB — HEPATIC FUNCTION PANEL
Alkaline Phosphatase: 74 U/L (ref 39–117)
Bilirubin, Direct: 0.1 mg/dL (ref 0.0–0.3)
Indirect Bilirubin: 0.4 mg/dL (ref 0.0–0.9)
Total Bilirubin: 0.5 mg/dL (ref 0.3–1.2)

## 2013-03-21 NOTE — Patient Instructions (Signed)
Fall precautions discussed Continue current meds and therapeutic lifestyle changes 

## 2013-03-21 NOTE — Progress Notes (Signed)
  Subjective:    Patient ID: [name] [name], female    DOB: 1923-11-26, 79 y.o.   MRN: [id number]  HPI Patient returns today for followup of chronic medical problems. She is accompanied by her daughter. The skin lesions on her he'll have improved and are drying. The chronic diarrhea continues and all she can do is to take Imodium frequently through the day. She does complain of her mouth today and her gums.  Review of Systems  Constitutional: Positive for fatigue.  HENT: Negative.   Eyes: Negative.   Respiratory: Negative.   Cardiovascular: Positive for chest pain (occasional).  Gastrointestinal: Positive for diarrhea (chronic).  Endocrine: Negative.   Genitourinary: Negative.   Musculoskeletal: Positive for back pain (intermitent).  Skin: Positive for color change (age spots).  Allergic/Immunologic: Negative.   Neurological: Negative.   Psychiatric/Behavioral: Negative.  Negative for sleep disturbance.       Objective:   Physical Exam BP 174/82  Pulse 55  Temp(Src) 98.7 F (37.1 C) (Oral)  Ht 5\' 3"  (1.6 m)  Wt 116 lb 3.2 oz (52.708 kg)  BMI 20.59 kg/m2  The patient appeared well nourished and normally developed for her age, and she was very alert and oriented to time and place. Speech, behavior and judgement appear normal. Vital signs as documented.  Head exam is unremarkable. No scleral icterus or pallor noted. Ears nose and throat were normal. Her gums are irritated and receding. Neck is without jugular venous distension, thyromegally, or carotid bruits. Carotid upstrokes are brisk bilaterally. No cervical adenopathy. Lungs are clear anteriorly and posteriorly to auscultation. Normal respiratory effort. Cardiac exam reveals regular rate and rhythm at 72 per minute. First and second heart sounds normal.  No murmurs, rubs or gallops.  Abdominal exam reveals  no masses, no organomegaly and no aortic enlargement. No inguinal adenopathy. The abdomen was tympanic. There was no  specific tenderness. The bullae on the left heel are healing and drying up. There was no erythema and no sign of any infection the right great toenail has a fungal infection which is being trimmed by the podiatrist . Extremities are nonedematous . Skin without pallor or jaundice.  Warm and dry, without rash. Neurologic exam reveals normal deep tendon reflexes and normal sensation.          Assessment & Plan:  1. Hyperlipemia - NMR Lipoprofile with Lipids; Standing - Hepatic function panel; Standing - NMR Lipoprofile with Lipids - Hepatic function panel  2. Fatigue - POCT CBC; Standing - POCT CBC  3. Hypertension - BASIC METABOLIC PANEL WITH GFR; Standing - BASIC METABOLIC PANEL WITH GFR  4. Vitamin D deficiency - Vitamin D 25 hydroxy; Standing - Vitamin D 25 hydroxy  Patient Instructions  Fall precautions discussed Continue current meds and therapeutic lifestyle changes   Continued good hydration

## 2013-03-22 LAB — BASIC METABOLIC PANEL WITH GFR
BUN: 16 mg/dL (ref 6–23)
CO2: 28 mEq/L (ref 19–32)
Chloride: 104 mEq/L (ref 96–112)
GFR, Est African American: 60 mL/min
Glucose, Bld: 93 mg/dL (ref 70–99)
Potassium: 4.1 mEq/L (ref 3.5–5.3)

## 2013-03-22 LAB — NMR LIPOPROFILE WITH LIPIDS
Cholesterol, Total: 126 mg/dL (ref <200)
HDL Particle Number: 32.8 umol/L (ref >=30.5)
HDL-C: 56 mg/dL (ref >=40)
LDL (calc): 50 mg/dL (ref <100)
LP-IR Score: 35 (ref <=45)
Large HDL-P: 11.3 umol/L (ref >=4.8)
Triglycerides: 102 mg/dL (ref <150)

## 2013-03-22 LAB — VITAMIN D 25 HYDROXY (VIT D DEFICIENCY, FRACTURES): Vit D, 25-Hydroxy: 31 ng/mL (ref 30–89)

## 2013-04-02 ENCOUNTER — Ambulatory Visit (INDEPENDENT_AMBULATORY_CARE_PROVIDER_SITE_OTHER): Payer: Medicare Other | Admitting: Family Medicine

## 2013-04-02 DIAGNOSIS — E538 Deficiency of other specified B group vitamins: Secondary | ICD-10-CM

## 2013-04-02 MED ORDER — CYANOCOBALAMIN 1000 MCG/ML IJ SOLN
1000.0000 ug | INTRAMUSCULAR | Status: DC
  Administered 2013-04-02 – 2013-06-14 (×3): 1000 ug via INTRAMUSCULAR

## 2013-04-02 NOTE — Patient Instructions (Signed)

## 2013-04-10 ENCOUNTER — Telehealth: Payer: Self-pay | Admitting: *Deleted

## 2013-04-10 NOTE — Telephone Encounter (Signed)
Pt's daughter notified of results.  

## 2013-04-10 NOTE — Telephone Encounter (Signed)
Message copied by Bearl Mulberry on Tue Apr 10, 2013  1:45 PM ------      Message from: Ernestina Penna      Created: Thu Mar 22, 2013 10:00 AM       Electrolytes blood sugar and kidney function tests all within normal limits.      All cholesterol numbers by advanced lipid testing were very good.      The vitamin D level was at the low into the normal range. She should increase vitamin D by 1000 daily      All liver function tests were normal      Call her daughter with these results and with a plan of action.       ------

## 2013-05-01 ENCOUNTER — Encounter: Payer: Self-pay | Admitting: Family Medicine

## 2013-05-10 ENCOUNTER — Ambulatory Visit (INDEPENDENT_AMBULATORY_CARE_PROVIDER_SITE_OTHER): Payer: Medicare Other | Admitting: *Deleted

## 2013-05-10 DIAGNOSIS — E538 Deficiency of other specified B group vitamins: Secondary | ICD-10-CM

## 2013-05-10 DIAGNOSIS — R300 Dysuria: Secondary | ICD-10-CM

## 2013-05-10 LAB — POCT URINALYSIS DIPSTICK
Ketones, UA: NEGATIVE
Protein, UA: NEGATIVE
Spec Grav, UA: 1.01
Urobilinogen, UA: NEGATIVE

## 2013-05-10 LAB — POCT UA - MICROSCOPIC ONLY: Casts, Ur, LPF, POC: NEGATIVE

## 2013-05-10 NOTE — Progress Notes (Signed)
Patient tolerated well.  Patient c/o dysuria today as well.  This is a frequent problem for her and daughter states that we typically just check her urine.  She would like to leave a urine specimen today.    U/A ordered and results will be sent to Dr. Christell Constant for review and f/u.

## 2013-05-10 NOTE — Patient Instructions (Addendum)
Vitamin B12 Injections Every person needs vitamin B12. A deficiency develops when the body does not get enough of it. One way to overcome this is by getting B12 shots (injections). A B12 shot puts the vitamin directly into muscle tissue. This avoids any problems your body might have in absorbing it from food or a pill. In some people, the body has trouble using the vitamin correctly. This can cause a B12 deficiency. Not consuming enough of the vitamin can also cause a deficiency. Getting enough vitamin B12 can be hard for elderly people. Sometimes, they do not eat a well-balanced diet. The elderly are also more likely than younger people to have medical conditions or take medications that can lead to a deficiency. WHAT DOES VITAMIN B12 DO? Vitamin B12 does many things to help the body work right:  It helps the body make healthy red blood cells.  It helps maintain nerve cells.  It is involved in the body's process of converting food into energy (metabolism).  It is needed to make the genetic material in all cells (DNA). VITAMIN B12 FOOD SOURCES Most people get plenty of vitamin B12 through the foods they eat. It is present in:  Meat, fish, poultry, and eggs.  Milk and milk products.  It also is added when certain foods are made, including some breads, cereals and yogurts. The food is then called "fortified". CAUSES The most common causes of vitamin B12 deficiency are:  Pernicious anemia. The condition develops when the body cannot make enough healthy red blood cells. This stems from a lack of a protein made in the stomach (intrinsic factor). People without this protein cannot absorb enough vitamin B12 from food.  Malabsorption. This is when the body cannot absorb the vitamin. It can be caused by:  Pernicious anemia.  Surgery to remove part or all of the stomach can lead to malabsorption. Removal of part or all of the small intestine can also cause malabsorption.  Vegetarian diet.  People who are strict about not eating foods from animals could have trouble taking in enough vitamin B12 from diet alone.  Medications. Some medicines have been linked to B12 deficiency, such as Metformin (a drug prescribed for type 2 diabetes). Long-term use of stomach acid suppressants also can keep the vitamin from being absorbed.  Intestinal problems such as inflammatory bowel disease. If there are problems in the digestive tract, vitamin B12 may not be absorbed in good enough amounts. SYMPTOMS People who do not get enough B12 can develop problems. These can include:  Anemia. This is when the body has too few red blood cells. Red blood cells carry oxygen to the rest of the body. Without a healthy supply of red blood cells, people can feel:  Tired (fatigued).  Weak.  Severe anemia can cause:  Shortness of breath.  Dizziness.  Rapid heart rate.  Paleness.  Other Vitamin B12 deficiency symptoms include:  Diarrhea.  Numbness or tingling in the hands or feet.  Loss of appetite.  Confusion.  Sores on the tongue or in the mouth. LET YOUR CAREGIVER KNOW ABOUT:  Any allergies. It is very important to know if you are allergic or sensitive to cobalt. Vitamin B12 contains cobalt.  Any history of kidney disease.  All medications you are taking. Include prescription and over-the-counter medicines, herbs and creams.  Whether you are pregnant or breast-feeding.  If you have Leber's disease, a hereditary eye condition, vitamin B12 could make it worse. RISKS AND COMPLICATIONS Reactions to an injection are   usually temporary. They might include:  Pain at the injection site.  Redness, swelling or tenderness at the site.  Headache, dizziness or weakness.  Nausea, upset stomach or diarrhea.  Numbness or tingling.  Fever.  Joint pain.  Itching or rash. If a reaction does not go away in a short while, talk with your healthcare provider. A change in the way the shots are  given, or where they are given, might need to be made. BEFORE AN INJECTION To decide whether B12 injections are right for you, your healthcare provider will probably:  Ask about your medical history.  Ask questions about your diet.  Ask about symptoms such as:  Have you felt weak?  Do you feel unusually tired?  Do you get dizzy?  Order blood tests. These may include a test to:  Check the level of red cells in your blood.  Measure B12 levels.  Check for the presence of intrinsic factor. VITAMIN B12 INJECTIONS How often you will need a vitamin B12 injection will depend on how severe your deficiency is. This also will affect how long you will need to get them. People with pernicious anemia usually get injections for their entire life. Others might get them for a shorter period. For many people, injections are given daily or weekly for several weeks. Then, once B12 levels are normal, injections are given just once a month. If the cause of the deficiency can be fixed, the injections can be stopped. Talk with your healthcare provider about what you should expect. For an injection:  The injection site will be cleaned with an alcohol swab.  Your healthcare provider will insert a needle directly into a muscle. Most any muscle can be used. Most often, an arm muscle is used. A buttocks muscle can also be used. Many people say shots in that area are less painful.  A small adhesive bandage may be put over the injection site. It usually can be taken off in an hour or less. Injections can be given by your healthcare provider. In some cases, family members give them. Sometimes, people give them to themselves. Talk with your healthcare provider about what would be best for you. If someone other than your healthcare provider will be giving the shots, the person will need to be trained to give them correctly. HOME CARE INSTRUCTIONS   You can remove the adhesive bandage within an hour of getting a  shot.  You should be able to go about your normal activities right away.  Avoid drinking large amounts of alcohol while taking vitamin B12 shots. Alcohol can interfere with the body's use of the vitamin. SEEK MEDICAL CARE IF:   Pain, redness, swelling or tenderness at the injection site does not get better or gets worse.  Headache, dizziness or weakness does not go away.  You develop a fever of more than 100.5 F (38.1 C). SEEK IMMEDIATE MEDICAL CARE IF:   You have chest pain.  You develop shortness of breath.  You have muscle weakness that gets worse.  You develop numbness, weakness or tingling on one side or one area of the body.  You have symptoms of an allergic reaction, such as:  Hives.  Difficulty breathing.  Swelling of the lips, face, tongue or throat.  You develop a fever of more than 102.0 F (38.9 C). MAKE SURE YOU:   Understand these instructions.  Will watch your condition.  Will get help right away if you are not doing well or get worse. Document   Released: 12/03/2008 Document Revised: 11/29/2011 Document Reviewed: 12/03/2008 [employer] Patient Information 2014 [employer].  

## 2013-05-12 ENCOUNTER — Other Ambulatory Visit: Payer: Self-pay | Admitting: *Deleted

## 2013-05-12 DIAGNOSIS — R300 Dysuria: Secondary | ICD-10-CM

## 2013-05-12 MED ORDER — CIPROFLOXACIN HCL 250 MG PO TABS: 250.0000 mg | ORAL_TABLET | Freq: Two times a day (BID) | ORAL | Status: DC

## 2013-06-14 ENCOUNTER — Ambulatory Visit (INDEPENDENT_AMBULATORY_CARE_PROVIDER_SITE_OTHER): Payer: Medicare Other | Admitting: *Deleted

## 2013-06-14 DIAGNOSIS — E538 Deficiency of other specified B group vitamins: Secondary | ICD-10-CM

## 2013-06-14 DIAGNOSIS — Z23 Encounter for immunization: Secondary | ICD-10-CM

## 2013-06-14 NOTE — Addendum Note (Signed)
Addended by: Ardine Eng A on: 06/14/2013 11:42 AM   Modules accepted: Orders

## 2013-06-27 ENCOUNTER — Encounter: Payer: Self-pay | Admitting: Family Medicine

## 2013-06-27 ENCOUNTER — Ambulatory Visit (INDEPENDENT_AMBULATORY_CARE_PROVIDER_SITE_OTHER): Payer: Medicare Other | Admitting: Family Medicine

## 2013-06-27 VITALS — BP 183/80 | HR 46 | Temp 97.6°F | Ht 63.0 in | Wt 116.0 lb

## 2013-06-27 DIAGNOSIS — E785 Hyperlipidemia, unspecified: Secondary | ICD-10-CM

## 2013-06-27 DIAGNOSIS — K529 Noninfective gastroenteritis and colitis, unspecified: Secondary | ICD-10-CM | POA: Insufficient documentation

## 2013-06-27 DIAGNOSIS — R3915 Urgency of urination: Secondary | ICD-10-CM

## 2013-06-27 DIAGNOSIS — E559 Vitamin D deficiency, unspecified: Secondary | ICD-10-CM | POA: Insufficient documentation

## 2013-06-27 DIAGNOSIS — R197 Diarrhea, unspecified: Secondary | ICD-10-CM

## 2013-06-27 DIAGNOSIS — I4891 Unspecified atrial fibrillation: Secondary | ICD-10-CM

## 2013-06-27 DIAGNOSIS — Z23 Encounter for immunization: Secondary | ICD-10-CM

## 2013-06-27 MED ORDER — PANTOPRAZOLE SODIUM 40 MG PO TBEC: 40.0000 mg | DELAYED_RELEASE_TABLET | Freq: Every day | ORAL | Status: DC

## 2013-06-27 MED ORDER — ISOSORBIDE MONONITRATE ER 30 MG PO TB24: 30.0000 mg | ORAL_TABLET | Freq: Every day | ORAL | Status: AC

## 2013-06-27 MED ORDER — METOPROLOL SUCCINATE 12.5 MG HALF TABLET: 12.5000 mg | ORAL_TABLET | Freq: Every day | ORAL | Status: DC

## 2013-06-27 MED ORDER — PANTOPRAZOLE SODIUM 40 MG PO TBEC: 40.0000 mg | DELAYED_RELEASE_TABLET | Freq: Every day | ORAL | Status: AC

## 2013-06-27 MED ORDER — FERROUS SULFATE 325 (65 FE) MG PO TABS: 325.0000 mg | ORAL_TABLET | Freq: Every day | ORAL | Status: AC

## 2013-06-27 MED ORDER — SOLIFENACIN SUCCINATE 10 MG PO TABS: 10.0000 mg | ORAL_TABLET | Freq: Every day | ORAL | Status: DC

## 2013-06-27 NOTE — Addendum Note (Signed)
Addended by: Tommas Olp on: 06/27/2013 12:49 PM   Modules accepted: Orders

## 2013-06-27 NOTE — Patient Instructions (Addendum)
Continue medications as doing Always be careful do not put yourself at risk for falling Continue therapeutic lifestyle changes Always drink plenty of fluids Get Prevnar today Try to wear Lifeline at nighttime with sleeping  Pneumococcal Vaccine, Polyvalent suspension for injection What is this medicine? PNEUMOCOCCAL VACCINE, POLYVALENT (NEU mo KOK al vak SEEN, pol ee VEY luhnt) is a vaccine to prevent pneumococcus bacteria infection. These bacteria are a major cause of ear infections, 'Strep throat' infections, and serious pneumonia, meningitis, or blood infections worldwide. These vaccines help the body to produce antibodies (protective substances) that help your body defend against these bacteria. This vaccine is recommended for infants and young children. This vaccine will not treat an infection. This medicine may be used for other purposes; ask your health care provider or pharmacist if you have questions. What should I tell my health care provider before I take this medicine? They need to know if you have any of these conditions: -bleeding problems -fever -immune system problems -low platelet count in the blood -seizures -an unusual or allergic reaction to pneumococcal vaccine, diphtheria toxoid, other vaccines, latex, other medicines, foods, dyes, or preservatives -pregnant or trying to get pregnant -breast-feeding How should I use this medicine? This vaccine is for injection into a muscle. It is given by a health care professional. A copy of Vaccine Information Statements will be given before each vaccination. Read this sheet carefully each time. The sheet may change frequently. Talk to your pediatrician regarding the use of this medicine in children. While this drug may be prescribed for children as young as 37 weeks old for selected conditions, precautions do apply. Overdosage: If you think you have taken too much of this medicine contact a poison control center or emergency room at  once. NOTE: This medicine is only for you. Do not share this medicine with others. What if I miss a dose? It is important not to miss your dose. Call your doctor or health care professional if you are unable to keep an appointment. What may interact with this medicine? -medicines for cancer chemotherapy -medicines that suppress your immune function -medicines that treat or prevent blood clots like warfarin, enoxaparin, and dalteparin -steroid medicines like prednisone or cortisone This list may not describe all possible interactions. Give your health care provider a list of all the medicines, herbs, non-prescription drugs, or dietary supplements you use. Also tell them if you smoke, drink alcohol, or use illegal drugs. Some items may interact with your medicine. What should I watch for while using this medicine? Mild fever and pain should go away in 3 days or less. Report any unusual symptoms to your doctor or health care professional. What side effects may I notice from receiving this medicine? Side effects that you should report to your doctor or health care professional as soon as possible: -allergic reactions like skin rash, itching or hives, swelling of the face, lips, or tongue -breathing problems -confused -fever over 102 degrees F -pain, tingling, numbness in the hands or feet -seizures -unusual bleeding or bruising -unusual muscle weakness Side effects that usually do not require medical attention (report to your doctor or health care professional if they continue or are bothersome): -aches and pains -diarrhea -fever of 102 degrees F or less -headache -irritable -loss of appetite -pain, tender at site where injected -trouble sleeping This list may not describe all possible side effects. Call your doctor for medical advice about side effects. You may report side effects to FDA at 1-800-FDA-1088. Where should  I keep my medicine? This does not apply. This vaccine is given in a  clinic, pharmacy, doctor's office, or other health care setting and will not be stored at home. NOTE: This sheet is a summary. It may not cover all possible information. If you have questions about this medicine, talk to your doctor, pharmacist, or health care provider.  2013, [employer]. (11/19/2008 10:17:22 AM)

## 2013-06-27 NOTE — Progress Notes (Signed)
Subjective:    Patient ID: [name] [name], female    DOB: [birth year], 79 y.o.   MRN: [id number]  HPI Pt here for follow up and management of chronic medical problems.  Patient comes in today with her daughter. Patient brings in her home blood pressure readings and these range from 120s over the 60s to as high as the 150s over 90s. Today she does not wear a Lifeline at bedtime she has at home a charter beside her table. She gets up in the night to use the bedside commode. See home blood pressures.   Patient Active Problem List   Diagnosis Date Noted  . Atrial fibrillation 12/22/2010  . Leg edema 12/22/2010   Outpatient Encounter Prescriptions as of 06/27/2013  Medication Sig Dispense Refill  . aspirin EC 81 MG tablet Take 81 mg by mouth daily.      . bifidobacterium infantis (ALIGN) capsule Take 1 capsule by mouth daily.      Marland Kitchen BIOTIN PO Take 1 tablet by mouth daily.      . Calcium Carb-Cholecalciferol (CALCIUM 500 +D) 500-400 MG-UNIT TABS Take 1 tablet by mouth 2 (two) times daily.      . cholecalciferol (VITAMIN D) 1000 UNITS tablet Take 1,000 Units by mouth daily.      . cyanocobalamin (,VITAMIN B-12,) 1000 MCG/ML injection Inject 1,000 mcg into the muscle every 30 (thirty) days.      . ferrous sulfate 325 (65 FE) MG tablet Take 325 mg by mouth daily with breakfast.      . GLUCOSAMINE PO Take 1 tablet by mouth daily.      . isosorbide mononitrate (IMDUR) 30 MG 24 hr tablet Take 1 tablet (30 mg total) by mouth daily.  30 tablet  11  . loperamide (IMODIUM A-D) 2 MG tablet Take 6 mg by mouth 4 (four) times daily. "Short bowel" syndrome.  30 tablet  0  . medium chain triglycerides (MCT OIL) oil Take 15 mLs by mouth 3 (three) times daily. Mix in glass with liquid.   For diarrhea and digestion help.      . metoprolol succinate (TOPROL XL) 25 MG 24 hr tablet Take 12.5 mg by mouth daily. Take 1/2 tablet daily  30 tablet  11  . Multiple Vitamin (MULTIVITAMIN WITH MINERALS) TABS Take 1 tablet by  mouth daily.      . pantoprazole (PROTONIX) 40 MG tablet Take 1 tablet (40 mg total) by mouth daily.  30 tablet  11  . solifenacin (VESICARE) 10 MG tablet Take 10 mg by mouth daily.      . [DISCONTINUED] ciprofloxacin (CIPRO) 250 MG tablet Take 1 tablet (250 mg total) by mouth 2 (two) times daily.  6 tablet  0  . [DISCONTINUED] cyanocobalamin ((VITAMIN B-12)) injection 1,000 mcg        No facility-administered encounter medications on file as of 06/27/2013.    Review of Systems  Constitutional: Negative.   HENT: Positive for dental problem.   Eyes: Negative.   Respiratory: Negative.   Cardiovascular: Negative.   Gastrointestinal: Negative.   Endocrine: Negative.   Genitourinary: Positive for urgency.  Musculoskeletal: Negative.   Skin: Negative.   Allergic/Immunologic: Negative.   Neurological: Negative.   Hematological: Negative.   Psychiatric/Behavioral: Negative.        Objective:   Physical Exam  Nursing note and vitals reviewed. Constitutional: She is oriented to person, place, and time. No distress.  Somewhat thin and frail but doing well for her age of 79  year  HENT:  Head: Normocephalic and atraumatic.  Right Ear: External ear normal.  Left Ear: External ear normal.  Nose: Nose normal.  Mouth/Throat: Oropharynx is clear and moist.  Hearing aid in left ear canal  Eyes: Conjunctivae and EOM are normal. Right eye exhibits no discharge. Left eye exhibits no discharge. No scleral icterus.  Neck: Normal range of motion. Neck supple. No thyromegaly present.  Cardiovascular: Normal rate, regular rhythm, normal heart sounds and intact distal pulses.  Exam reveals no gallop and no friction rub.   No murmur heard. At 60 per minute  Pulmonary/Chest: Effort normal and breath sounds normal. No respiratory distress. She has no wheezes. She has no rales. She exhibits no tenderness.  Abdominal: Soft. Bowel sounds are normal. She exhibits no distension and no mass. There is no  tenderness. There is no rebound and no guarding.  Musculoskeletal: Normal range of motion. She exhibits no edema and no tenderness.  Lymphadenopathy:    She has no cervical adenopathy.  Neurological: She is alert and oriented to person, place, and time. No cranial nerve deficit.  Skin: Skin is warm and dry. No rash noted.  Psychiatric: She has a normal mood and affect. Her behavior is normal. Judgment and thought content normal.   BP 183/80  Pulse 46  Temp(Src) 97.6 F (36.4 C) (Oral)  Ht 5\' 3"  (1.6 m)  Wt 116 lb (52.617 kg)  BMI 20.55 kg/m2        Assessment & Plan:   1. Atrial fibrillation   2. Hyperlipidemia   3. Vitamin D deficiency   4. Chronic diarrhea   5. Urinary urgency    Orders Placed This Encounter  Procedures  . Pneumococcal conjugate vaccine 13-valent less than 5yo IM  . Hepatic function panel  . BMP8+EGFR  . Lipid panel  . Vit D  25 hydroxy (rtn osteoporosis monitoring)  . POCT CBC   Meds ordered this encounter  Medications  . ferrous sulfate 325 (65 FE) MG tablet    Sig: Take 1 tablet (325 mg total) by mouth daily with breakfast.    Dispense:  90 tablet    Refill:  3  . isosorbide mononitrate (IMDUR) 30 MG 24 hr tablet    Sig: Take 1 tablet (30 mg total) by mouth daily.    Dispense:  90 tablet    Refill:  3  . pantoprazole (PROTONIX) 40 MG tablet    Sig: Take 1 tablet (40 mg total) by mouth daily.    Dispense:  30 tablet    Refill:  11  . solifenacin (VESICARE) 10 MG tablet    Sig: Take 1 tablet (10 mg total) by mouth daily.    Dispense:  90 tablet    Refill:  3  . DISCONTD: metoprolol succinate (TOPROL-XL) 12.5 mg TB24 24 hr tablet    Sig: Take 0.5 tablets (12.5 mg total) by mouth daily.    Dispense:  90 tablet    Refill:  3  . metoprolol succinate (TOPROL-XL) 12.5 mg TB24 24 hr tablet    Sig: Take 0.5 tablets (12.5 mg total) by mouth daily.    Dispense:  90 tablet    Refill:  3   Patient Instructions  Continue medications as  doing Always be careful do not put yourself at risk for falling Continue therapeutic lifestyle changes Always drink plenty of fluids Get Prevnar today Try to wear Lifeline at nighttime with sleeping  Pneumococcal Vaccine, Polyvalent suspension for injection What is this medicine?  PNEUMOCOCCAL VACCINE, POLYVALENT (NEU mo KOK al vak SEEN, pol ee VEY luhnt) is a vaccine to prevent pneumococcus bacteria infection. These bacteria are a major cause of ear infections, 'Strep throat' infections, and serious pneumonia, meningitis, or blood infections worldwide. These vaccines help the body to produce antibodies (protective substances) that help your body defend against these bacteria. This vaccine is recommended for infants and young children. This vaccine will not treat an infection. This medicine may be used for other purposes; ask your health care provider or pharmacist if you have questions. What should I tell my health care provider before I take this medicine? They need to know if you have any of these conditions: -bleeding problems -fever -immune system problems -low platelet count in the blood -seizures -an unusual or allergic reaction to pneumococcal vaccine, diphtheria toxoid, other vaccines, latex, other medicines, foods, dyes, or preservatives -pregnant or trying to get pregnant -breast-feeding How should I use this medicine? This vaccine is for injection into a muscle. It is given by a health care professional. A copy of Vaccine Information Statements will be given before each vaccination. Read this sheet carefully each time. The sheet may change frequently. Talk to your pediatrician regarding the use of this medicine in children. While this drug may be prescribed for children as young as 4 weeks old for selected conditions, precautions do apply. Overdosage: If you think you have taken too much of this medicine contact a poison control center or emergency room at once. NOTE: This  medicine is only for you. Do not share this medicine with others. What if I miss a dose? It is important not to miss your dose. Call your doctor or health care professional if you are unable to keep an appointment. What may interact with this medicine? -medicines for cancer chemotherapy -medicines that suppress your immune function -medicines that treat or prevent blood clots like warfarin, enoxaparin, and dalteparin -steroid medicines like prednisone or cortisone This list may not describe all possible interactions. Give your health care provider a list of all the medicines, herbs, non-prescription drugs, or dietary supplements you use. Also tell them if you smoke, drink alcohol, or use illegal drugs. Some items may interact with your medicine. What should I watch for while using this medicine? Mild fever and pain should go away in 3 days or less. Report any unusual symptoms to your doctor or health care professional. What side effects may I notice from receiving this medicine? Side effects that you should report to your doctor or health care professional as soon as possible: -allergic reactions like skin rash, itching or hives, swelling of the face, lips, or tongue -breathing problems -confused -fever over 102 degrees F -pain, tingling, numbness in the hands or feet -seizures -unusual bleeding or bruising -unusual muscle weakness Side effects that usually do not require medical attention (report to your doctor or health care professional if they continue or are bothersome): -aches and pains -diarrhea -fever of 102 degrees F or less -headache -irritable -loss of appetite -pain, tender at site where injected -trouble sleeping This list may not describe all possible side effects. Call your doctor for medical advice about side effects. You may report side effects to FDA at 1-800-FDA-1088. Where should I keep my medicine? This does not apply. This vaccine is given in a clinic, pharmacy,  doctor's office, or other health care setting and will not be stored at home. NOTE: This sheet is a summary. It may not cover all possible information. If you  have questions about this medicine, talk to your doctor, pharmacist, or health care provider.  2013, [employer]. (11/19/2008 10:17:22 AM)      Nyra Capes MD

## 2013-06-28 ENCOUNTER — Other Ambulatory Visit (INDEPENDENT_AMBULATORY_CARE_PROVIDER_SITE_OTHER): Payer: Medicare Other

## 2013-06-28 DIAGNOSIS — N390 Urinary tract infection, site not specified: Secondary | ICD-10-CM

## 2013-06-28 LAB — POCT URINALYSIS DIPSTICK
Bilirubin, UA: NEGATIVE
Blood, UA: NEGATIVE
Ketones, UA: NEGATIVE
Spec Grav, UA: 1.02
Urobilinogen, UA: NEGATIVE

## 2013-06-28 LAB — CBC WITH DIFFERENTIAL/PLATELET
Basophils Absolute: 0 10*3/uL (ref 0.0–0.2)
Eosinophils Absolute: 0.1 10*3/uL (ref 0.0–0.4)
HCT: 37.1 % (ref 34.0–46.6)
Hemoglobin: 12.4 g/dL (ref 11.1–15.9)
Immature Granulocytes: 0 %
Lymphocytes Absolute: 1 10*3/uL (ref 0.7–3.1)
MCH: 30.2 pg (ref 26.6–33.0)
MCHC: 33.4 g/dL (ref 31.5–35.7)
MCV: 90 fL (ref 79–97)
Monocytes Absolute: 0.4 10*3/uL (ref 0.1–0.9)
Monocytes: 8 %
Neutrophils Absolute: 3.9 10*3/uL (ref 1.4–7.0)
RDW: 14.3 % (ref 12.3–15.4)

## 2013-06-28 LAB — POCT UA - MICROSCOPIC ONLY
Bacteria, U Microscopic: NEGATIVE
WBC, Ur, HPF, POC: NEGATIVE
Yeast, UA: NEGATIVE

## 2013-06-28 LAB — BMP8+EGFR
Calcium: 9.6 mg/dL (ref 8.6–10.2)
Creatinine, Ser: 0.8 mg/dL (ref 0.57–1.00)
GFR calc non Af Amer: 67 mL/min/{1.73_m2} (ref >59)
Glucose: 88 mg/dL (ref 65–99)
Sodium: 147 mmol/L — ABNORMAL HIGH (ref 134–144)

## 2013-06-28 LAB — HEPATIC FUNCTION PANEL
ALT: 12 IU/L (ref 0–32)
Albumin: 4 g/dL (ref 3.5–4.7)
Alkaline Phosphatase: 73 IU/L (ref 39–117)
Bilirubin, Direct: 0.15 mg/dL (ref 0.00–0.40)
Total Protein: 6.3 g/dL (ref 6.0–8.5)

## 2013-06-28 LAB — LIPID PANEL
Cholesterol, Total: 121 mg/dL (ref 100–199)
HDL: 63 mg/dL (ref >39)
Triglycerides: 99 mg/dL (ref 0–149)
VLDL Cholesterol Cal: 20 mg/dL (ref 5–40)

## 2013-07-19 ENCOUNTER — Ambulatory Visit (INDEPENDENT_AMBULATORY_CARE_PROVIDER_SITE_OTHER): Payer: Medicare Other

## 2013-07-19 DIAGNOSIS — E538 Deficiency of other specified B group vitamins: Secondary | ICD-10-CM

## 2013-07-19 MED ORDER — CYANOCOBALAMIN 1000 MCG/ML IJ SOLN
1000.0000 ug | INTRAMUSCULAR | Status: AC
  Administered 2013-07-19 – 2013-10-03 (×3): 1000 ug via INTRAMUSCULAR

## 2013-07-23 ENCOUNTER — Encounter: Payer: Self-pay | Admitting: Family Medicine

## 2013-07-23 ENCOUNTER — Ambulatory Visit (INDEPENDENT_AMBULATORY_CARE_PROVIDER_SITE_OTHER): Payer: Medicare Other | Admitting: Family Medicine

## 2013-07-23 ENCOUNTER — Telehealth: Payer: Self-pay | Admitting: Family Medicine

## 2013-07-23 VITALS — BP 157/84 | HR 57 | Temp 98.3°F | Ht 63.0 in | Wt 117.0 lb

## 2013-07-23 DIAGNOSIS — R300 Dysuria: Secondary | ICD-10-CM

## 2013-07-23 DIAGNOSIS — R109 Unspecified abdominal pain: Secondary | ICD-10-CM

## 2013-07-23 DIAGNOSIS — R198 Other specified symptoms and signs involving the digestive system and abdomen: Secondary | ICD-10-CM

## 2013-07-23 DIAGNOSIS — R5381 Other malaise: Secondary | ICD-10-CM

## 2013-07-23 DIAGNOSIS — Z85038 Personal history of other malignant neoplasm of large intestine: Secondary | ICD-10-CM

## 2013-07-23 DIAGNOSIS — R231 Pallor: Secondary | ICD-10-CM

## 2013-07-23 DIAGNOSIS — R194 Change in bowel habit: Secondary | ICD-10-CM

## 2013-07-23 LAB — POCT CBC
Granulocyte percent: 73.2 %G (ref 37–80)
Lymph, poc: 1.3 (ref 0.6–3.4)
MCH, POC: 29.5 pg (ref 27–31.2)
MCHC: 33 g/dL (ref 31.8–35.4)
MPV: 8.1 fL (ref 0–99.8)
POC Granulocyte: 4.7 (ref 2–6.9)
POC LYMPH PERCENT: 20.2 %L (ref 10–50)
Platelet Count, POC: 156 10*3/uL (ref 142–424)
RBC: 4.1 M/uL (ref 4.04–5.48)

## 2013-07-23 LAB — POCT UA - MICROSCOPIC ONLY
Casts, Ur, LPF, POC: NEGATIVE
Crystals, Ur, HPF, POC: NEGATIVE
Mucus, UA: NEGATIVE
Yeast, UA: NEGATIVE

## 2013-07-23 LAB — POCT URINALYSIS DIPSTICK
Nitrite, UA: NEGATIVE
Protein, UA: NEGATIVE
Urobilinogen, UA: NEGATIVE
pH, UA: 5

## 2013-07-23 NOTE — Patient Instructions (Addendum)
                Urinary Tract Infection Urinary tract infections (UTIs) can develop anywhere along your urinary tract. Your urinary tract is your body's drainage system for removing wastes and extra water. Your urinary tract includes two kidneys, two ureters, a bladder, and a urethra. Your kidneys are a pair of bean-shaped organs. Each kidney is about the size of your fist. They are located below your ribs, one on each side of your spine. CAUSES Infections are caused by microbes, which are microscopic organisms, including fungi, viruses, and bacteria. These organisms are so small that they can only be seen through a microscope. Bacteria are the microbes that most commonly cause UTIs. SYMPTOMS  Symptoms of UTIs may vary by age and gender of the patient and by the location of the infection. Symptoms in young women typically include a frequent and intense urge to urinate and a painful, burning feeling in the bladder or urethra during urination. Older women and men are more likely to be tired, shaky, and weak and have muscle aches and abdominal pain. A fever may mean the infection is in your kidneys. Other symptoms of a kidney infection include pain in your back or sides below the ribs, nausea, and vomiting. DIAGNOSIS To diagnose a UTI, your caregiver will ask you about your symptoms. Your caregiver also will ask to provide a urine sample. The urine sample will be tested for bacteria and white blood cells. White blood cells are made by your body to help fight infection. TREATMENT  Typically, UTIs can be treated with medication. Because most UTIs are caused by a bacterial infection, they usually can be treated with the use of antibiotics. The choice of antibiotic and length of treatment depend on your symptoms and the type of bacteria causing your infection. HOME CARE INSTRUCTIONS  If you were prescribed antibiotics, take them exactly as your caregiver instructs you. Finish the medication even  if you feel better after you have only taken some of the medication.  Drink enough water and fluids to keep your urine clear or pale yellow.  Avoid caffeine, tea, and carbonated beverages. They tend to irritate your bladder.  Empty your bladder often. Avoid holding urine for long periods of time.  Empty your bladder before and after sexual intercourse.  After a bowel movement, women should cleanse from front to back. Use each tissue only once. SEEK MEDICAL CARE IF:   You have back pain.  You develop a fever.  Your symptoms do not begin to resolve within 3 days. SEEK IMMEDIATE MEDICAL CARE IF:   You have severe back pain or lower abdominal pain.  You develop chills.  You have nausea or vomiting.  You have continued burning or discomfort with urination. MAKE SURE YOU:   Understand these instructions.  Will watch your condition.  Will get help right away if you are not doing well or get worse. Document Released: 06/16/2005 Document Revised: 03/07/2012 Document Reviewed: 10/15/2011 [facility] Patient Information 2014 [place], [place].   We will arrange for you to have a CT scan of the abdomen and pelvis because of the abdominal pain and a change in bowel habit. We will try to have the gastroenterologist get a copy of this report. For now continue current treatment Continue plenty of fluids Bring back FOBT We will call you with the results of the labs once available

## 2013-07-23 NOTE — Progress Notes (Signed)
Subjective:    Patient ID: [name] [name], female    DOB: 01-Nov-1923, 79 y.o.   MRN: [id number]  HPI Patient here today for possible UTI. Patient has had abdominal pain and back pain. She comes to the visit with her daughter today. The daughter indicates that she has consistently been taking 12 Imodium a day he for at least 3 years for her short bowel syndrome. Over the past couple weeks the Imodium has decreased to 8 daily with episodic constipation. The patient says that she has seen some blood in the toilet water after voiding. She has been more tired and weak than usual    Patient Active Problem List   Diagnosis Date Noted  . Chronic diarrhea, secondary to short bowel syndrome 06/27/2013  . Vitamin D deficiency 06/27/2013  . Atrial fibrillation, history of 12/22/2010  . Leg edema 12/22/2010   Outpatient Encounter Prescriptions as of 07/23/2013  Medication Sig  . aspirin EC 81 MG tablet Take 81 mg by mouth daily.  . bifidobacterium infantis (ALIGN) capsule Take 1 capsule by mouth daily.  Marland Kitchen BIOTIN PO Take 1 tablet by mouth daily.  . Calcium Carb-Cholecalciferol (CALCIUM 500 +D) 500-400 MG-UNIT TABS Take 1 tablet by mouth 2 (two) times daily.  . cholecalciferol (VITAMIN D) 1000 UNITS tablet Take 1,000 Units by mouth daily.  . cyanocobalamin (,VITAMIN B-12,) 1000 MCG/ML injection Inject 1,000 mcg into the muscle every 30 (thirty) days.  . ferrous sulfate 325 (65 FE) MG tablet Take 1 tablet (325 mg total) by mouth daily with breakfast.  . GLUCOSAMINE PO Take 1 tablet by mouth daily.  . isosorbide mononitrate (IMDUR) 30 MG 24 hr tablet Take 1 tablet (30 mg total) by mouth daily.  Marland Kitchen loperamide (IMODIUM A-D) 2 MG tablet Take 6 mg by mouth 4 (four) times daily. "Short bowel" syndrome.  . medium chain triglycerides (MCT OIL) oil Take 15 mLs by mouth 3 (three) times daily. Mix in glass with liquid.   For diarrhea and digestion help.  . metoprolol succinate (TOPROL-XL) 12.5 mg TB24 24 hr tablet  Take 0.5 tablets (12.5 mg total) by mouth daily.  . Multiple Vitamin (MULTIVITAMIN WITH MINERALS) TABS Take 1 tablet by mouth daily.  . pantoprazole (PROTONIX) 40 MG tablet Take 1 tablet (40 mg total) by mouth daily.  . solifenacin (VESICARE) 10 MG tablet Take 1 tablet (10 mg total) by mouth daily.    Review of Systems  Constitutional: Negative.   HENT: Negative.   Eyes: Negative.   Respiratory: Negative.   Cardiovascular: Negative.   Gastrointestinal: Positive for abdominal pain (tenderness).  Endocrine: Negative.   Genitourinary: Positive for dysuria and hematuria.  Musculoskeletal: Positive for back pain (low back pain).  Skin: Negative.   Allergic/Immunologic: Negative.   Neurological: Negative.   Hematological: Negative.   Psychiatric/Behavioral: Negative.        Objective:   Physical Exam  Nursing note and vitals reviewed. Constitutional: She is oriented to person, place, and time. No distress.  BN somewhat frail and pale 79 year old female  HENT:  Head: Normocephalic and atraumatic.  Eyes: Conjunctivae and EOM are normal. Pupils are equal, round, and reactive to light. Right eye exhibits no discharge. Left eye exhibits no discharge. No scleral icterus.  Neck: Normal range of motion. Neck supple. No thyromegaly present.  Cardiovascular: Normal rate, regular rhythm and normal heart sounds.   No murmur heard. At 72 per minute  Pulmonary/Chest: Effort normal and breath sounds normal. No respiratory distress. She has no wheezes.  She has no rales. She exhibits no tenderness.  Abdominal: Soft. Bowel sounds are normal. She exhibits no distension and no mass. There is no tenderness. There is no rebound and no guarding.  Musculoskeletal: She exhibits no edema and no tenderness.  Patient has some weakness and generalized gait instability  Lymphadenopathy:    She has no cervical adenopathy.  Neurological: She is alert and oriented to person, place, and time.  Skin: Skin is warm  and dry. No rash noted. She is not diaphoretic.  Psychiatric: She has a normal mood and affect. Her behavior is normal. Judgment and thought content normal.   BP 157/84  Pulse 57  Temp(Src) 98.3 F (36.8 C) (Oral)  Ht 5\' 3"  (1.6 m)  Wt 117 lb (53.071 kg)  BMI 20.73 kg/m2  Results for orders placed in visit on 07/23/13  POCT UA - MICROSCOPIC ONLY      Result Value Range   WBC, Ur, HPF, POC rare     RBC, urine, microscopic 1-5     Bacteria, U Microscopic negative     Mucus, UA negative     Epithelial cells, urine per micros occ     Crystals, Ur, HPF, POC negative     Casts, Ur, LPF, POC negative     Yeast, UA negative    POCT URINALYSIS DIPSTICK      Result Value Range   Color, UA yellow     Clarity, UA clear     Glucose, UA negative     Bilirubin, UA negative     Ketones, UA negative     Spec Grav, UA 1.010     Blood, UA trace     pH, UA 5.0     Protein, UA negative     Urobilinogen, UA negative     Nitrite, UA negative     Leukocytes, UA Negative            Assessment & Plan:  1. Dysuria - POCT UA - Microscopic Only - POCT urinalysis dipstick - Urine culture - POCT CBC - Thyroid Panel With TSH - BMP8+EGFR  2. Lower abdominal pain, unspecified laterality - POCT CBC - Thyroid Panel With TSH - BMP8+EGFR - CT Abdomen Pelvis W Contrast; Future  3. Malaise and fatigue  4. Pallor  5. History of colon cancer  6. Change in bowel habits --CT of abdomen and pelvis with contrast --FOBT given to patient  Patient Instructions                 Urinary Tract Infection Urinary tract infections (UTIs) can develop anywhere along your urinary tract. Your urinary tract is your body's drainage system for removing wastes and extra water. Your urinary tract includes two kidneys, two ureters, a bladder, and a urethra. Your kidneys are a pair of bean-shaped organs. Each kidney is about the size of your fist. They are located below your ribs, one on each side of  your spine. CAUSES Infections are caused by microbes, which are microscopic organisms, including fungi, viruses, and bacteria. These organisms are so small that they can only be seen through a microscope. Bacteria are the microbes that most commonly cause UTIs. SYMPTOMS  Symptoms of UTIs may vary by age and gender of the patient and by the location of the infection. Symptoms in young women typically include a frequent and intense urge to urinate and a painful, burning feeling in the bladder or urethra during urination. Older women and men are more likely to be  tired, shaky, and weak and have muscle aches and abdominal pain. A fever may mean the infection is in your kidneys. Other symptoms of a kidney infection include pain in your back or sides below the ribs, nausea, and vomiting. DIAGNOSIS To diagnose a UTI, your caregiver will ask you about your symptoms. Your caregiver also will ask to provide a urine sample. The urine sample will be tested for bacteria and white blood cells. White blood cells are made by your body to help fight infection. TREATMENT  Typically, UTIs can be treated with medication. Because most UTIs are caused by a bacterial infection, they usually can be treated with the use of antibiotics. The choice of antibiotic and length of treatment depend on your symptoms and the type of bacteria causing your infection. HOME CARE INSTRUCTIONS  If you were prescribed antibiotics, take them exactly as your caregiver instructs you. Finish the medication even if you feel better after you have only taken some of the medication.  Drink enough water and fluids to keep your urine clear or pale yellow.  Avoid caffeine, tea, and carbonated beverages. They tend to irritate your bladder.  Empty your bladder often. Avoid holding urine for long periods of time.  Empty your bladder before and after sexual intercourse.  After a bowel movement, women should cleanse from front to back. Use each tissue  only once. SEEK MEDICAL CARE IF:   You have back pain.  You develop a fever.  Your symptoms do not begin to resolve within 3 days. SEEK IMMEDIATE MEDICAL CARE IF:   You have severe back pain or lower abdominal pain.  You develop chills.  You have nausea or vomiting.  You have continued burning or discomfort with urination. MAKE SURE YOU:   Understand these instructions.  Will watch your condition.  Will get help right away if you are not doing well or get worse. Document Released: 06/16/2005 Document Revised: 03/07/2012 Document Reviewed: 10/15/2011 [facility] Patient Information 2014 [place], [place].   We will arrange for you to have a CT scan of the abdomen and pelvis because of the abdominal pain and a change in bowel habit. We will try to have the gastroenterologist get a copy of this report. For now continue current treatment Continue plenty of fluids Bring back FOBT We will call you with the results of the labs once available   Nyra Capes MD

## 2013-07-23 NOTE — Telephone Encounter (Signed)
Pt has hematuria and lower abd pain appt scheduled

## 2013-07-24 ENCOUNTER — Other Ambulatory Visit: Payer: Medicare Other

## 2013-07-24 LAB — BMP8+EGFR
BUN/Creatinine Ratio: 16 (ref 11–26)
CO2: 27 mmol/L (ref 18–29)
Calcium: 9.1 mg/dL (ref 8.6–10.2)
Creatinine, Ser: 0.79 mg/dL (ref 0.57–1.00)
Glucose: 115 mg/dL — ABNORMAL HIGH (ref 65–99)
Potassium: 4 mmol/L (ref 3.5–5.2)
Sodium: 135 mmol/L (ref 134–144)

## 2013-07-24 LAB — THYROID PANEL WITH TSH
T3 Uptake Ratio: 34 % (ref 24–39)
T4, Total: 6.4 ug/dL (ref 4.5–12.0)
TSH: 2.12 u[IU]/mL (ref 0.450–4.500)

## 2013-07-26 ENCOUNTER — Ambulatory Visit
Admission: RE | Admit: 2013-07-26 | Discharge: 2013-07-26 | Disposition: A | Discharge status: Home / Self Care | Payer: Medicare Other | Source: Ambulatory Visit | Attending: Family Medicine | Admitting: Family Medicine

## 2013-07-26 MED ORDER — IOHEXOL 300 MG/ML  SOLN
100.0000 mL | Freq: Once | INTRAMUSCULAR | Status: AC | PRN
  Administered 2013-07-26: 100 mL via INTRAVENOUS

## 2013-07-27 ENCOUNTER — Encounter: Payer: Self-pay | Admitting: Family Medicine

## 2013-07-30 ENCOUNTER — Other Ambulatory Visit: Payer: Self-pay | Admitting: *Deleted

## 2013-07-30 DIAGNOSIS — N939 Abnormal uterine and vaginal bleeding, unspecified: Secondary | ICD-10-CM

## 2013-07-31 ENCOUNTER — Encounter: Payer: Self-pay | Admitting: *Deleted

## 2013-07-31 ENCOUNTER — Other Ambulatory Visit: Payer: Self-pay | Admitting: *Deleted

## 2013-07-31 DIAGNOSIS — N939 Abnormal uterine and vaginal bleeding, unspecified: Secondary | ICD-10-CM

## 2013-08-01 ENCOUNTER — Telehealth: Payer: Self-pay

## 2013-08-01 NOTE — Telephone Encounter (Signed)
Returning call from Kristen

## 2013-08-02 ENCOUNTER — Encounter: Payer: Self-pay | Admitting: Obstetrics & Gynecology

## 2013-08-02 NOTE — Telephone Encounter (Signed)
KH spoke to daughter yesterday evening

## 2013-08-07 ENCOUNTER — Ambulatory Visit (INDEPENDENT_AMBULATORY_CARE_PROVIDER_SITE_OTHER): Payer: Medicare Other | Admitting: Obstetrics & Gynecology

## 2013-08-07 ENCOUNTER — Encounter: Payer: Self-pay | Admitting: Obstetrics & Gynecology

## 2013-08-07 VITALS — BP 157/73 | HR 49 | Resp 16 | Ht 64.0 in | Wt 112.0 lb

## 2013-08-07 DIAGNOSIS — N950 Postmenopausal bleeding: Secondary | ICD-10-CM | POA: Insufficient documentation

## 2013-08-07 NOTE — Progress Notes (Signed)
Patient ID: [name] [name], female   DOB: 25-May-1924, 79 y.o.   MRN: [id number]  Chief Complaint  Patient presents with  . Gynecologic Exam    Postmenopausal bleeding since 07/26/13    HPI [name] [name] is a 79 y.o. female.  Postmenopausal>30 y referred for vaginal bleeding noted about 2 weeks ago with some low abdominal pain, now some brownish discharge. CT results reviewed.Distended endometrial cavity with fluid, likely complicated by  hemorrhage. No mass is evident by CT. HPI  Past Medical History  Diagnosis Date  . Cancer   . Arrhythmia   . Hypertension     Past Surgical History  Procedure Laterality Date  . Bowel resection    . Colon cancer    . Appendectomy      Family History  Problem Relation Age of Onset  . Heart disease Mother   . Heart disease Sister   . Heart disease Brother     Social History History  Substance Use Topics  . Smoking status: Never Smoker   . Smokeless tobacco: Not on file  . Alcohol Use: No    Allergies  Allergen Reactions  . Celebrex [Celecoxib]   . Meprobamate   . Rofecoxib     Current Outpatient Prescriptions  Medication Sig Dispense Refill  . aspirin EC 81 MG tablet Take 81 mg by mouth daily.      . bifidobacterium infantis (ALIGN) capsule Take 1 capsule by mouth daily.      Marland Kitchen BIOTIN PO Take 1 tablet by mouth daily.      . Calcium Carb-Cholecalciferol (CALCIUM 500 +D) 500-400 MG-UNIT TABS Take 1 tablet by mouth 2 (two) times daily.      . cholecalciferol (VITAMIN D) 1000 UNITS tablet Take 1,000 Units by mouth daily.      . cyanocobalamin (,VITAMIN B-12,) 1000 MCG/ML injection Inject 1,000 mcg into the muscle every 30 (thirty) days.      . ferrous sulfate 325 (65 FE) MG tablet Take 1 tablet (325 mg total) by mouth daily with breakfast.  90 tablet  3  . GLUCOSAMINE PO Take 1 tablet by mouth daily.      . isosorbide mononitrate (IMDUR) 30 MG 24 hr tablet Take 1 tablet (30 mg total) by mouth daily.  90 tablet  3  . loperamide (IMODIUM  A-D) 2 MG tablet Take 6 mg by mouth 4 (four) times daily. "Short bowel" syndrome.  30 tablet  0  . medium chain triglycerides (MCT OIL) oil Take 15 mLs by mouth 3 (three) times daily. Mix in glass with liquid.   For diarrhea and digestion help.      . metoprolol succinate (TOPROL-XL) 12.5 mg TB24 24 hr tablet Take 0.5 tablets (12.5 mg total) by mouth daily.  90 tablet  3  . Multiple Vitamin (MULTIVITAMIN WITH MINERALS) TABS Take 1 tablet by mouth daily.      . pantoprazole (PROTONIX) 40 MG tablet Take 1 tablet (40 mg total) by mouth daily.  90 tablet  3  . solifenacin (VESICARE) 10 MG tablet Take 1 tablet (10 mg total) by mouth daily.  90 tablet  3   Current Facility-Administered Medications  Medication Dose Route Frequency Provider Last Rate Last Dose  . cyanocobalamin ((VITAMIN B-12)) injection 1,000 mcg  1,000 mcg Intramuscular Q30 days Ernestina Penna, MD   1,000 mcg at 07/19/13 1152    Review of Systems Review of Systems  Constitutional: Positive for unexpected weight change (5 lb). Negative for fever and  appetite change.  Gastrointestinal: Negative for abdominal pain and abdominal distention.  Genitourinary: Positive for vaginal bleeding, vaginal discharge and pelvic pain (mild). Negative for dysuria and hematuria.    Blood pressure 157/73, pulse 49, resp. rate 16, height 5\' 4"  (1.626 m), weight 112 lb (50.803 kg).  Physical Exam Physical Exam  Constitutional: She is oriented to person, place, and time. No distress.  Frail but pleasant, alert  Pulmonary/Chest: Effort normal. No respiratory distress.  Abdominal: Soft. She exhibits no distension and no mass. There is no tenderness.  Genitourinary: Uterus normal. Vaginal discharge found.  Old blood at cervix  Neurological: She is alert and oriented to person, place, and time.  Skin: Skin is warm and dry. There is pallor.  Psychiatric: She has a normal mood and affect. Her behavior is normal.   Patient given informed consent, signed  copy in the chart, time out was performed. Appropriate time out taken. The patient was placed in the lithotomy position and the cervix brought into view with sterile speculum.  Portio of cervix cleansed x 2 with betadine swabs.  A tenaculum was placed in the anterior lip of the cervix.  The cervix was dilated.The uterus was sounded for depth of 8 cm. A pipelle was introduced to into the uterus, suction created,  and an endometrial sample was obtained, mostly dark blood. All equipment was removed and accounted for.  The patient tolerated the procedure well.    Patient given post procedure instructions. The patient will return in 2 weeks for results. Data Reviewed CLINICAL DATA: Lower abdominal pain, diarrhea and constipation.  Vaginal or rectal bleeding. History of colon cancer in 2001 status  post surgery, chemotherapy, and radiation.  EXAM:  CT ABDOMEN AND PELVIS WITH CONTRAST  TECHNIQUE:  Multidetector CT imaging of the abdomen and pelvis was performed  using the standard protocol following bolus administration of  intravenous contrast.  CONTRAST: OMNIPAQUE IOHEXOL 300 MG/ML SOLN  COMPARISON: 10/06/2010  FINDINGS:  Mild dependent atelectasis at the lung bases. Trace right pleural  effusion.  Coronary atherosclerosis.  Liver, spleen, and adrenal glands are within normal limits.  Fatty atrophy of the pancreas.  Suspected gallbladder is contracted (series 2/ image 40). No  intrahepatic or extrahepatic ductal dilatation.  Kidneys are within normal limits. Right extrarenal pelvis. No  hydronephrosis.  No evidence of bowel obstruction. Prior distal colonic resection  with suture line in the lower pelvis (series 2/ image 72). Mild  rectal wall thickening (series 2/image 75).  Atherosclerotic calcifications of the abdominal aorta and branch  vessels.  Small volume perihepatic and pelvic ascites. Mild presacral soft  tissue stranding (series 2/image 73), likely reflecting   postprocedural changes.  Small retroperitoneal lymph nodes measuring up to 9 mm short axis,  within the upper limits of normal.  Distended endometrial cavity with fluid, likely complicated by  hemorrhage. No mass is evident by CT.  Bladder is unremarkable.  Calcified pelvic phleboliths.  Degenerative changes of the visualized thoracolumbar spine. Mild  S-shaped thoracolumbar scoliosis.  IMPRESSION:  Prior distal colonic resection.  Mild rectal wall thickening, possibly reflecting radiation changes.  Associated presacral soft tissue/stranding, favored to be  postprocedural.  Small volume abdominopelvic ascites.  Distended endometrial cavity with fluid/hemorrhage, suggesting  underlying cervical stenosis. No mass is evident by CT.  Electronically Signed  By: Charline Bills M.D.  On: 07/26/2013 14:00          Result Notes    Notes Recorded by Leilani Merl, LPN on 16/06/9603  at 12:55 PM Daughter aware and we have initiated an appt with Dr Debroah Loop ------  Notes Recorded by Ernestina Penna, MD on 07/26/2013 at 2:16 PM I called and left a message with Joyce Gross, I believe because of the symptoms that she is having she should make a return visit with a gastroenterologist. The message k was emotes the interpretation of the CT scan is you talk to her she needs for me to talk further I'll be glad to do that. Otherwise I will schedule her visit with her gastroenterologist because of the change in her bowel habits and her abdominal symptoms        Assessment    Postmenopausal bleeding in 79 year old patient with H/O colon cancer     Plan    Review result of biopsy at return in 2 weeks        ARNOLD,JAMES 08/07/2013, 2:33 PM

## 2013-08-08 ENCOUNTER — Telehealth: Payer: Self-pay | Admitting: Family Medicine

## 2013-08-10 ENCOUNTER — Telehealth: Payer: Self-pay | Admitting: *Deleted

## 2013-08-10 NOTE — Telephone Encounter (Signed)
Called pt to see if they would come in for earlier appt - spoke to pt daughter but she would like to keep Dec 1st appt as scheduled - Dr Debroah Loop to call pt with results per pt request

## 2013-08-20 ENCOUNTER — Ambulatory Visit (INDEPENDENT_AMBULATORY_CARE_PROVIDER_SITE_OTHER): Payer: Medicare Other | Admitting: Obstetrics & Gynecology

## 2013-08-20 ENCOUNTER — Encounter: Payer: Self-pay | Admitting: Obstetrics & Gynecology

## 2013-08-20 VITALS — BP 153/77 | HR 76 | Resp 16 | Ht 64.0 in | Wt 113.0 lb

## 2013-08-20 DIAGNOSIS — C549 Malignant neoplasm of corpus uteri, unspecified: Secondary | ICD-10-CM

## 2013-08-20 DIAGNOSIS — C541 Malignant neoplasm of endometrium: Secondary | ICD-10-CM | POA: Insufficient documentation

## 2013-08-20 NOTE — Patient Instructions (Signed)
Uterine Cancer Uterine cancer is an abnormal growth of tissue (tumor) in the uterus that is cancerous (malignant). Unlike noncancerous (benign) tumors, malignant tumors can spread to other parts of your body. The wall of the uterus has two layers of tissue. The inner layer is the endometrium. The outer layer of muscle tissue is the myometrium. The most common type of uterine cancer begins in the endometrium. This is called endometrial cancer. Cancer that begins in the myometrium is called uterine sarcoma, which is very rare.  RISK FACTORS  Although the exact cause of uterine cancer is unknown, there are a number of risk factors that can increase your chances of getting uterine cancer. They include:  Your age. Uterine cancer occurs mostly in women older than 50 years.   Having an enlarged endometrium (endometrial hyperplasia).   Using hormone therapy.   Obesity.   Taking the drug tamoxifen.   White race.   Infertility.   Never being pregnant.   Beginning menstrual periods at an age younger than 12 years.   Having menstrual periods at an age older than 52 years.   Personal history of ovarian, intestinal, or colorectal cancer.   Having a family history of uterine cancer.   Having a family history of hereditary nonpolyposis colon cancer (HNPCC).   Having diabetes, high blood pressure, thyroid disease, or gallbladder disease.   Long-term use of high-dose birth control pills.   Exposure to radiation.   Smoking.  SIGNS AND SYMPTOMS   Abnormal vaginal bleeding or discharge. Bleeding may start as a watery, blood-streaked flow that gradually contains more blood.   Any vaginal bleeding after menopause.   Difficult or painful urination.   Pain during intercourse.   Pain in the pelvic area.  Mass in the vagina.  Pain or fullness in the abdomen.  Frequent urination.  Bleeding between periods.  Growth of the stomach.   Unexplained weight loss.   Uterine cancer usually occurs after menopause. However, it may also occur around the time that menopause begins. Abnormal vaginal bleeding is the most common symptom of uterine cancer. Women should not assume that abnormal vaginal bleeding is part of menopause. DIAGNOSIS  Your health care provider will ask about your medical history. He or she may also perform a number of procedures, such as:  A physical and pelvic exam. Your health care provider will feel your pelvis for any lumps.   Blood and urine tests.   X-rays.   Imaging tests, such as CT scans, ultrasonography, or MRIs.   A hysteroscopy to view the inside of your uterus.   A Pap test to sample cells from the cervix and upper vagina to check for abnormal cells.   Taking a tissue sample (biopsy) from the uterine lining to look for cancer cells.   A dilation and curettage (D&C). This involves stretching (dilation) the cervix and scraping (curettage) the inside lining of the uterus to get a tissue sample. The sample is examined under a microscope to look for cancer cells.  Your cancer will be staged to determine its severity and extent. Staging is a careful attempt to find out the size of the tumor, whether the cancer has spread, and if so, to what parts of the body. You may need to have more tests to determine the stage of your cancer. The test results will help determine what treatment plan is best for you. Cancer stages include:   Stage I The cancer is only found in the uterus.  Stage II The   cancer has spread to the cervix.  Stage III The cancer has spread outside the uterus, but not outside the pelvis. The cancer may have spread to the lymph nodes in the pelvis.  Stage IV The cancer has spread to other parts of the body, such as the bladder or rectum. TREATMENT  Most women with uterine cancer are treated with surgery. This includes removing the uterus, cervix, fallopian tubes, and ovaries (total hysterectomy). Your  lymph nodes near the tumor may also be removed. Some women have radiation, chemotherapy, or hormonal therapy. Other women have a combination of these therapies. HOME CARE INSTRUCTIONS   Only take over-the-counter or prescription medicines as directed by your health care provider.   Maintain a healthy diet.  Exercise regularly.   If you have diabetes, high blood pressure, thyroid disease, or gallbladder disease, follow your health care provider's instructions to keep it under control.   Do not smoke.   Consider joining a support group. This may help you learn to cope with the stress of having uterine cancer.   Seek advice to help you manage treatment side effects.   Keep all follow-up appointments as directed by your health care provider.  SEEK MEDICAL CARE IF:  You have increased stomach or pelvic pain.  You cannot urinate.  You have abnormal bleeding. Document Released: 09/06/2005 Document Revised: 05/09/2013 Document Reviewed: 02/23/2013 [employer] Patient Information 2014 [employer].  

## 2013-08-21 ENCOUNTER — Encounter: Payer: Self-pay | Admitting: Gynecology

## 2013-08-21 ENCOUNTER — Ambulatory Visit: Payer: Medicare Other | Attending: Gynecology | Admitting: Gynecology

## 2013-08-21 VITALS — BP 143/78 | HR 78 | Temp 98.6°F | Resp 16 | Ht 64.0 in | Wt 111.9 lb

## 2013-08-21 DIAGNOSIS — C189 Malignant neoplasm of colon, unspecified: Secondary | ICD-10-CM | POA: Insufficient documentation

## 2013-08-21 DIAGNOSIS — C549 Malignant neoplasm of corpus uteri, unspecified: Secondary | ICD-10-CM | POA: Insufficient documentation

## 2013-08-21 DIAGNOSIS — C541 Malignant neoplasm of endometrium: Secondary | ICD-10-CM

## 2013-08-21 NOTE — Progress Notes (Signed)
Consult Note: Gyn-Onc   [name] [name] 79 y.o. female  Chief Complaint  Patient presents with  . Endometrial Cancer    New Consult    Assessment :Clear cell carcinoma of the endometrium.  The patient refuses surgery (likely because prior surgeries have been significantly complicated and she is also elderly).  Option for external beam radiation is precluded because of prior pelvic radiation for colon cancer.    Plan:  We will ask that the patient see Dr. Roselind Messier with consideration as to whether intrauterine brachy therapy might be applied.  The patient and her daughter understand that this is not the usual approach and that Dr. Roselind Messier may feel it is not appropriate.   If radiation therapy cannot be used, the patient will return to me once for to re-discuss surgery.  I really don't have other options to offer.     HPI:  79 yo WF seen in consultation at the request of Dr. Scheryl Darter for management of a recently diagnosed clear cell carcinoma of the endometrium.  The patient began bleeding in early November.  She saw Dr. Debroah Loop on 08/07/13.  An endometrial biopsy was obtained.  The patient may have had some weight loss, but has no other constitutional symptoms.  Denies pain or pelvic pressure.  Her past history is complicated by prior colon surgery followed by pelvic radiation and chemotherapy in 2001.  Shortly after her first surgery she had to be reexplored for a small bowel obstruction.  She had a second small bowel obstruction and had a 3 month in hospital recovery from that surgery.  Otherwise, the patient is relatively healthy (although 79 yo) and lives alone.  She comes accompanied by her only living daughter.    Review of Systems:10 point review of systems is negative except as noted in HPI..   Vitals: Blood pressure 143/78, pulse 78, temperature 98.6 F (37 C), temperature source Oral, resp. rate 16, height 5\' 4"  (1.626 m), weight 111 lb 14.4 oz (50.758 kg).  Physical Exam: General  : The patient is a healthy slender frail woman in no acute distress.  HEENT: normocephalic, extraoccular movements normal; neck is supple without thyromegally  Lynphnodes: Supraclavicular and inguinal nodes not enlarged  Abdomen: Soft, non-tender, no ascites, no organomegally, no masses, no hernias  Long midline incision is well healed Pelvic:  EGBUS: Normal female  Vagina: atrophic without lesions Urethra and Bladder: base feels slightly thickened (consistent with prior radiation) non-tender  Cervix: atrophic, blood from os Uterus: difficult to outline.  There is radiation fibrosis of adjacent tissues. Bi-manual examination: Non-tender; no adenxal masses or nodularity  Rectal: normal sphincter tone, no masses, no blood  Lower extremities: No edema or varicosities. Normal range of motion      Allergies  Allergen Reactions  . Celebrex [Celecoxib]   . Meprobamate   . Rofecoxib     Past Medical History  Diagnosis Date  . Cancer   . Arrhythmia   . Hypertension   . Colon cancer     Past Surgical History  Procedure Laterality Date  . Bowel resection    . Colon cancer    . Appendectomy    . Colectomy N/A     Current Outpatient Prescriptions  Medication Sig Dispense Refill  . amoxicillin (AMOXIL) 500 MG capsule       . aspirin EC 81 MG tablet Take 81 mg by mouth daily.      . bifidobacterium infantis (ALIGN) capsule Take 1 capsule by mouth daily.      Marland Kitchen  BIOTIN PO Take 1 tablet by mouth daily.      . Calcium Carb-Cholecalciferol (CALCIUM 500 +D) 500-400 MG-UNIT TABS Take 1 tablet by mouth 2 (two) times daily.      . cholecalciferol (VITAMIN D) 1000 UNITS tablet Take 1,000 Units by mouth daily.      . cyanocobalamin (,VITAMIN B-12,) 1000 MCG/ML injection Inject 1,000 mcg into the muscle every 30 (thirty) days.      . ferrous sulfate 325 (65 FE) MG tablet Take 1 tablet (325 mg total) by mouth daily with breakfast.  90 tablet  3  . GLUCOSAMINE PO Take 1 tablet by mouth daily.       . isosorbide mononitrate (IMDUR) 30 MG 24 hr tablet Take 1 tablet (30 mg total) by mouth daily.  90 tablet  3  . loperamide (IMODIUM A-D) 2 MG tablet Take 6 mg by mouth 4 (four) times daily. "Short bowel" syndrome.  30 tablet  0  . medium chain triglycerides (MCT OIL) oil Take 15 mLs by mouth 3 (three) times daily. Mix in glass with liquid.   For diarrhea and digestion help.      . metoprolol succinate (TOPROL-XL) 12.5 mg TB24 24 hr tablet Take 0.5 tablets (12.5 mg total) by mouth daily.  90 tablet  3  . Multiple Vitamin (MULTIVITAMIN WITH MINERALS) TABS Take 1 tablet by mouth daily.      . pantoprazole (PROTONIX) 40 MG tablet Take 1 tablet (40 mg total) by mouth daily.  90 tablet  3  . solifenacin (VESICARE) 10 MG tablet Take 1 tablet (10 mg total) by mouth daily.  90 tablet  3   Current Facility-Administered Medications  Medication Dose Route Frequency Provider Last Rate Last Dose  . cyanocobalamin ((VITAMIN B-12)) injection 1,000 mcg  1,000 mcg Intramuscular Q30 days Ernestina Penna, MD   1,000 mcg at 07/19/13 1152    History   Social History  . Marital Status: Widowed    Spouse Name: N/A    Number of Children: N/A  . Years of Education: N/A   Occupational History  . Not on file.   Social History Main Topics  . Smoking status: Never Smoker   . Smokeless tobacco: Not on file  . Alcohol Use: No  . Drug Use: No  . Sexual Activity: No   Other Topics Concern  . Not on file   Social History Narrative  . No narrative on file    Family History  Problem Relation Age of Onset  . Heart disease Mother   . Heart disease Sister   . Heart disease Brother       [name] Corpus, MD 08/21/2013, 2:56 PM       Consult Note: Gyn-Onc   [name] [name] 79 y.o. female  Chief Complaint  Patient presents with  . Endometrial Cancer    New Consult    Assessment :  Plan:  Interval History:   HPI:  Review of Systems:10 point review of systems is negative except as  noted in interval history.   Vitals: Blood pressure 143/78, pulse 78, temperature 98.6 F (37 C), temperature source Oral, resp. rate 16, height 5\' 4"  (1.626 m), weight 111 lb 14.4 oz (50.758 kg).  Physical Exam: General : The patient is a healthy woman in no acute distress.  HEENT: normocephalic, extraoccular movements normal; neck is supple without thyromegally  Lynphnodes: Supraclavicular and inguinal nodes not enlarged  Abdomen: Soft, non-tender, no ascites, no organomegally, no masses, no hernias  Pelvic:  EGBUS: Normal female  Vagina: Normal, no lesions  Urethra and Bladder: Normal, non-tender  Cervix: Surgically absent  Uterus: Surgically absent  Bi-manual examination: Non-tender; no adenxal masses or nodularity  Rectal: normal sphincter tone, no masses, no blood  Lower extremities: No edema or varicosities. Normal range of motion      Allergies  Allergen Reactions  . Celebrex [Celecoxib]   . Meprobamate   . Rofecoxib     Past Medical History  Diagnosis Date  . Cancer   . Arrhythmia   . Hypertension   . Colon cancer     Past Surgical History  Procedure Laterality Date  . Bowel resection    . Colon cancer    . Appendectomy    . Colectomy N/A     Current Outpatient Prescriptions  Medication Sig Dispense Refill  . amoxicillin (AMOXIL) 500 MG capsule       . aspirin EC 81 MG tablet Take 81 mg by mouth daily.      . bifidobacterium infantis (ALIGN) capsule Take 1 capsule by mouth daily.      Marland Kitchen BIOTIN PO Take 1 tablet by mouth daily.      . Calcium Carb-Cholecalciferol (CALCIUM 500 +D) 500-400 MG-UNIT TABS Take 1 tablet by mouth 2 (two) times daily.      . cholecalciferol (VITAMIN D) 1000 UNITS tablet Take 1,000 Units by mouth daily.      . cyanocobalamin (,VITAMIN B-12,) 1000 MCG/ML injection Inject 1,000 mcg into the muscle every 30 (thirty) days.      . ferrous sulfate 325 (65 FE) MG tablet Take 1 tablet (325 mg total) by mouth daily with breakfast.  90  tablet  3  . GLUCOSAMINE PO Take 1 tablet by mouth daily.      . isosorbide mononitrate (IMDUR) 30 MG 24 hr tablet Take 1 tablet (30 mg total) by mouth daily.  90 tablet  3  . loperamide (IMODIUM A-D) 2 MG tablet Take 6 mg by mouth 4 (four) times daily. "Short bowel" syndrome.  30 tablet  0  . medium chain triglycerides (MCT OIL) oil Take 15 mLs by mouth 3 (three) times daily. Mix in glass with liquid.   For diarrhea and digestion help.      . metoprolol succinate (TOPROL-XL) 12.5 mg TB24 24 hr tablet Take 0.5 tablets (12.5 mg total) by mouth daily.  90 tablet  3  . Multiple Vitamin (MULTIVITAMIN WITH MINERALS) TABS Take 1 tablet by mouth daily.      . pantoprazole (PROTONIX) 40 MG tablet Take 1 tablet (40 mg total) by mouth daily.  90 tablet  3  . solifenacin (VESICARE) 10 MG tablet Take 1 tablet (10 mg total) by mouth daily.  90 tablet  3   Current Facility-Administered Medications  Medication Dose Route Frequency Provider Last Rate Last Dose  . cyanocobalamin ((VITAMIN B-12)) injection 1,000 mcg  1,000 mcg Intramuscular Q30 days Ernestina Penna, MD   1,000 mcg at 07/19/13 1152    History   Social History  . Marital Status: Widowed    Spouse Name: N/A    Number of Children: N/A  . Years of Education: N/A   Occupational History  . Not on file.   Social History Main Topics  . Smoking status: Never Smoker   . Smokeless tobacco: Not on file  . Alcohol Use: No  . Drug Use: No  . Sexual Activity: No   Other Topics Concern  . Not on file  Social History Narrative  . No narrative on file    Family History  Problem Relation Age of Onset  . Heart disease Mother   . Heart disease Sister   . Heart disease Brother       [name] Corpus, MD 08/21/2013, 2:56 PM

## 2013-08-21 NOTE — Patient Instructions (Signed)
You will see Dr Roselind Messier for consultation regarding possible radiation implant.

## 2013-08-21 NOTE — Progress Notes (Signed)
Patient ID: [name] [name], female   DOB: 1923-10-08, 79 y.o.   MRN: [id number] No obstetric history on file. No LMP recorded. Patient is postmenopausal. Endometrial biopsy 11/18 shows endometrial clear cell carcinoma. I discussed the result with the patient and her daughter and she agrees to referral to Gyn Onc for discussion of management options.   Adam Phenix, MD 08/20/13

## 2013-08-22 ENCOUNTER — Telehealth: Payer: Self-pay | Admitting: Gynecologic Oncology

## 2013-08-22 NOTE — Telephone Encounter (Signed)
Message left with upcoming appointment with Dr. Roselind Messier for Dec. 10 at 9:30am.  Instructed to please call the office for any questions or concerns.

## 2013-08-22 NOTE — Addendum Note (Signed)
Addended by: Warner Mccreedy D on: 08/22/2013 09:56 AM   Modules accepted: Orders

## 2013-08-23 ENCOUNTER — Ambulatory Visit (INDEPENDENT_AMBULATORY_CARE_PROVIDER_SITE_OTHER): Payer: Medicare Other | Admitting: *Deleted

## 2013-08-23 DIAGNOSIS — E538 Deficiency of other specified B group vitamins: Secondary | ICD-10-CM

## 2013-08-23 NOTE — Patient Instructions (Signed)
Vitamin B12 Injections Every person needs vitamin B12. A deficiency develops when the body does not get enough of it. One way to overcome this is by getting B12 shots (injections). A B12 shot puts the vitamin directly into muscle tissue. This avoids any problems your body might have in absorbing it from food or a pill. In some people, the body has trouble using the vitamin correctly. This can cause a B12 deficiency. Not consuming enough of the vitamin can also cause a deficiency. Getting enough vitamin B12 can be hard for elderly people. Sometimes, they do not eat a well-balanced diet. The elderly are also more likely than younger people to have medical conditions or take medications that can lead to a deficiency. WHAT DOES VITAMIN B12 DO? Vitamin B12 does many things to help the body work right:  It helps the body make healthy red blood cells.  It helps maintain nerve cells.  It is involved in the body's process of converting food into energy (metabolism).  It is needed to make the genetic material in all cells (DNA). VITAMIN B12 FOOD SOURCES Most people get plenty of vitamin B12 through the foods they eat. It is present in:  Meat, fish, poultry, and eggs.  Milk and milk products.  It also is added when certain foods are made, including some breads, cereals and yogurts. The food is then called "fortified". CAUSES The most common causes of vitamin B12 deficiency are:  Pernicious anemia. The condition develops when the body cannot make enough healthy red blood cells. This stems from a lack of a protein made in the stomach (intrinsic factor). People without this protein cannot absorb enough vitamin B12 from food.  Malabsorption. This is when the body cannot absorb the vitamin. It can be caused by:  Pernicious anemia.  Surgery to remove part or all of the stomach can lead to malabsorption. Removal of part or all of the small intestine can also cause malabsorption.  Vegetarian diet.  People who are strict about not eating foods from animals could have trouble taking in enough vitamin B12 from diet alone.  Medications. Some medicines have been linked to B12 deficiency, such as Metformin (a drug prescribed for type 2 diabetes). Long-term use of stomach acid suppressants also can keep the vitamin from being absorbed.  Intestinal problems such as inflammatory bowel disease. If there are problems in the digestive tract, vitamin B12 may not be absorbed in good enough amounts. SYMPTOMS People who do not get enough B12 can develop problems. These can include:  Anemia. This is when the body has too few red blood cells. Red blood cells carry oxygen to the rest of the body. Without a healthy supply of red blood cells, people can feel:  Tired (fatigued).  Weak.  Severe anemia can cause:  Shortness of breath.  Dizziness.  Rapid heart rate.  Paleness.  Other Vitamin B12 deficiency symptoms include:  Diarrhea.  Numbness or tingling in the hands or feet.  Loss of appetite.  Confusion.  Sores on the tongue or in the mouth. LET YOUR CAREGIVER KNOW ABOUT:  Any allergies. It is very important to know if you are allergic or sensitive to cobalt. Vitamin B12 contains cobalt.  Any history of kidney disease.  All medications you are taking. Include prescription and over-the-counter medicines, herbs and creams.  Whether you are pregnant or breast-feeding.  If you have Leber's disease, a hereditary eye condition, vitamin B12 could make it worse. RISKS AND COMPLICATIONS Reactions to an injection are   usually temporary. They might include:  Pain at the injection site.  Redness, swelling or tenderness at the site.  Headache, dizziness or weakness.  Nausea, upset stomach or diarrhea.  Numbness or tingling.  Fever.  Joint pain.  Itching or rash. If a reaction does not go away in a short while, talk with your healthcare provider. A change in the way the shots are  given, or where they are given, might need to be made. BEFORE AN INJECTION To decide whether B12 injections are right for you, your healthcare provider will probably:  Ask about your medical history.  Ask questions about your diet.  Ask about symptoms such as:  Have you felt weak?  Do you feel unusually tired?  Do you get dizzy?  Order blood tests. These may include a test to:  Check the level of red cells in your blood.  Measure B12 levels.  Check for the presence of intrinsic factor. VITAMIN B12 INJECTIONS How often you will need a vitamin B12 injection will depend on how severe your deficiency is. This also will affect how long you will need to get them. People with pernicious anemia usually get injections for their entire life. Others might get them for a shorter period. For many people, injections are given daily or weekly for several weeks. Then, once B12 levels are normal, injections are given just once a month. If the cause of the deficiency can be fixed, the injections can be stopped. Talk with your healthcare provider about what you should expect. For an injection:  The injection site will be cleaned with an alcohol swab.  Your healthcare provider will insert a needle directly into a muscle. Most any muscle can be used. Most often, an arm muscle is used. A buttocks muscle can also be used. Many people say shots in that area are less painful.  A small adhesive bandage may be put over the injection site. It usually can be taken off in an hour or less. Injections can be given by your healthcare provider. In some cases, family members give them. Sometimes, people give them to themselves. Talk with your healthcare provider about what would be best for you. If someone other than your healthcare provider will be giving the shots, the person will need to be trained to give them correctly. HOME CARE INSTRUCTIONS   You can remove the adhesive bandage within an hour of getting a  shot.  You should be able to go about your normal activities right away.  Avoid drinking large amounts of alcohol while taking vitamin B12 shots. Alcohol can interfere with the body's use of the vitamin. SEEK MEDICAL CARE IF:   Pain, redness, swelling or tenderness at the injection site does not get better or gets worse.  Headache, dizziness or weakness does not go away.  You develop a fever of more than 100.5 F (38.1 C). SEEK IMMEDIATE MEDICAL CARE IF:   You have chest pain.  You develop shortness of breath.  You have muscle weakness that gets worse.  You develop numbness, weakness or tingling on one side or one area of the body.  You have symptoms of an allergic reaction, such as:  Hives.  Difficulty breathing.  Swelling of the lips, face, tongue or throat.  You develop a fever of more than 102.0 F (38.9 C). MAKE SURE YOU:   Understand these instructions.  Will watch your condition.  Will get help right away if you are not doing well or get worse. Document   Released: 12/03/2008 Document Revised: 11/29/2011 Document Reviewed: 12/03/2008 [employer] Patient Information 2014 [employer].  

## 2013-08-23 NOTE — Progress Notes (Signed)
Patient tolerated well.

## 2013-08-29 ENCOUNTER — Ambulatory Visit
Admission: RE | Admit: 2013-08-29 | Discharge: 2013-08-29 | Disposition: A | Discharge status: Home / Self Care | Payer: Medicare Other | Source: Ambulatory Visit | Attending: Radiation Oncology | Admitting: Radiation Oncology

## 2013-08-29 ENCOUNTER — Encounter: Payer: Self-pay | Admitting: Radiation Oncology

## 2013-08-29 VITALS — BP 119/63 | HR 56 | Temp 98.1°F | Ht 64.0 in | Wt 110.6 lb

## 2013-08-29 DIAGNOSIS — C541 Malignant neoplasm of endometrium: Secondary | ICD-10-CM

## 2013-08-29 DIAGNOSIS — Z9221 Personal history of antineoplastic chemotherapy: Secondary | ICD-10-CM | POA: Insufficient documentation

## 2013-08-29 DIAGNOSIS — Z85048 Personal history of other malignant neoplasm of rectum, rectosigmoid junction, and anus: Secondary | ICD-10-CM | POA: Insufficient documentation

## 2013-08-29 DIAGNOSIS — C549 Malignant neoplasm of corpus uteri, unspecified: Secondary | ICD-10-CM | POA: Insufficient documentation

## 2013-08-29 DIAGNOSIS — Z923 Personal history of irradiation: Secondary | ICD-10-CM | POA: Insufficient documentation

## 2013-08-29 HISTORY — DX: Malignant neoplasm of endometrium: C54.1

## 2013-08-29 NOTE — Progress Notes (Addendum)
GYN Location of Tumor / Histology: Endrometrial Adenocarcinoma  Patient presented with vaginal bleeding in early November 2014.  Biopsies of endometrium revealed:    Endometrium, biopsy - ENDOMETRIAL ADENOCARCINOMA WITH CLEAR CELL FEATURES  Past/Anticipated interventions by Gyn/Onc surgery, if any: Patient does not want surgery.  Past/Anticipated interventions by medical oncology, if any: none  Weight changes, if any: lost 2 lbs in last couple of weeks  Bowel/Bladder complaints, if any: sometimes feels like she does not empty her bladder completely.  Also.has diarrhea from shortened bowel.  Nausea/Vomiting, if any: no  Pain issues, if any:  Chronic lower back pain.  SAFETY ISSUES:  Prior radiation? Yes - whole pelvis 3960 cGy 04/11/00-05/16/00  Pacemaker/ICD? no  Possible current pregnancy? no  Is the patient on methotrexate? no  Current Complaints / other details:  Patient had chemotherapy and radiation in 2001 for adenocarcinoma of the upper rectum.  Here to see if she can have intrauterine brachytherapy.  Has 2 children.  Her with her daughter.

## 2013-08-29 NOTE — Progress Notes (Signed)
Radiation Oncology         [phone] ________________________________  Initial outpatient Consultation  Name: [name] [name] MRN: [id number]  Date: 08/29/2013  DOB: 05-Mar-1924  UX:LKGMW, Dorinda Hill, MD  Stanford Breed Reuel Boom , Thom Chimes, MD- Lennie Hummer  REFERRING PHYSICIAN: De Blanch *  DIAGNOSIS: Endometrial clear cell adenocarcinoma  HISTORY OF PRESENT ILLNESS::[name] [name] is a 79 y.o. female who is seen out of the courtesy of Dr. Serita Kyle for an opinion concerning radiation therapy as part of management of patient's recently diagnosed clear cell adenocarcinoma of the uterus. Patient has a prior history of stage CI adenocarcinoma of the upper rectum. She underwent surgery followed by a limited course of postoperative radiation treatments and 5-FU chemotherapy. she tolerated her radiation and chemotherapy poorly and required hospitalization. She only completed 39.6 gray of  the planned approximately 50.4 gray.  Shortly after the patient's first surgery she required reexploration for a small bowel obstruction. She had developed a second small bowel obstruction and remained in the hospital approximately 3 months for recovery from that surgery. The patient has done reasonably well up until recently when she began developing vaginal bleeding. She was seen by Dr. Scheryl Darter and endometrial biopsy revealed clear cell adenocarcinoma. The patient was seen by gynecologic oncology. Surgery was recommended for management of her new malignancy. Given the patient's age and her prior complications related to surgery she was hesitant to consider additional operation. She is now seen in radiation oncology for consideration for definitive treatment.  PREVIOUS RADIATION THERAPY: Yes 39.6 gray directed at the pelvis area in 2001 under the direction of Dr. Jackelyn Knife.  PAST MEDICAL HISTORY:  has a past medical history of Cancer; Arrhythmia; Hypertension; Colon cancer; and Endometrial  cancer.    PAST SURGICAL HISTORY: Past Surgical History  Procedure Laterality Date  . Bowel resection    . Colon cancer    . Appendectomy    . Colectomy N/A     FAMILY HISTORY: family history includes Colon cancer in her sister; Heart disease in her brother, mother, and sister.  SOCIAL HISTORY:  reports that she has never smoked. She does not have any smokeless tobacco history on file. She reports that she does not drink alcohol or use illicit drugs.  ALLERGIES: Celebrex; Meprobamate; and Rofecoxib  MEDICATIONS:  Current Outpatient Prescriptions  Medication Sig Dispense Refill  . aspirin EC 81 MG tablet Take 81 mg by mouth daily.      . bifidobacterium infantis (ALIGN) capsule Take 1 capsule by mouth daily.      Marland Kitchen BIOTIN PO Take 1 tablet by mouth daily.      . Calcium Carb-Cholecalciferol (CALCIUM 500 +D) 500-400 MG-UNIT TABS Take 1 tablet by mouth 2 (two) times daily.      . cholecalciferol (VITAMIN D) 1000 UNITS tablet Take 1,000 Units by mouth daily.      . cyanocobalamin (,VITAMIN B-12,) 1000 MCG/ML injection Inject 1,000 mcg into the muscle every 30 (thirty) days.      . ferrous sulfate 325 (65 FE) MG tablet Take 1 tablet (325 mg total) by mouth daily with breakfast.  90 tablet  3  . GLUCOSAMINE PO Take 1 tablet by mouth daily.      . isosorbide mononitrate (IMDUR) 30 MG 24 hr tablet Take 1 tablet (30 mg total) by mouth daily.  90 tablet  3  . loperamide (IMODIUM A-D) 2 MG tablet Take 6 mg by mouth 4 (four) times daily. "Short bowel" syndrome.  30 tablet  0  . medium chain triglycerides (MCT OIL) oil Take 15 mLs by mouth 3 (three) times daily. Mix in glass with liquid.   For diarrhea and digestion help.      . metoprolol succinate (TOPROL-XL) 12.5 mg TB24 24 hr tablet Take 0.5 tablets (12.5 mg total) by mouth daily.  90 tablet  3  . Multiple Vitamin (MULTIVITAMIN WITH MINERALS) TABS Take 1 tablet by mouth daily.      . pantoprazole (PROTONIX) 40 MG tablet Take 1 tablet (40 mg  total) by mouth daily.  90 tablet  3  . solifenacin (VESICARE) 10 MG tablet Take 1 tablet (10 mg total) by mouth daily.  90 tablet  3   Current Facility-Administered Medications  Medication Dose Route Frequency Provider Last Rate Last Dose  . cyanocobalamin ((VITAMIN B-12)) injection 1,000 mcg  1,000 mcg Intramuscular Q30 days Ernestina Penna, MD   1,000 mcg at 08/23/13 1136    REVIEW OF SYSTEMS:  A 15 point review of systems is documented in the electronic medical record. This was obtained by the nursing staff. However, I reviewed this with the patient to discuss relevant findings and make appropriate changes.  She has had some low back pain but denies any pelvic pain. She has some serosanguineous drainage for which she wears a pad. she denies any significant vaginal bleeding at this time. she denies any hematuria or rectal bleeding. Her energy level is reasonable. She denies any appetite changes.   PHYSICAL EXAM: Vital signs are stable and reviewed in the electronic medical record  General Appearance:    Alert, cooperative, no distress, appears stated age,  somewhat hard of hearing. The patient is accompanied by her daughter on evaluation today   Head:    Normocephalic, without obvious abnormality, atraumatic  Eyes:    PERRL, conjunctiva/corneas clear, EOM's intact    Ears:    Normal TM's and external ear canals, both ears  Nose:   Nares normal, septum midline, mucosa normal, no drainage    or sinus tenderness  Throat:   Lips, mucosa, and tongue normal; dentures in place gums normal  Neck:   Supple, symmetrical, trachea midline, no adenopathy;    thyroid:  no enlargement/tenderness/nodules; no carotid   bruit or JVD  Back:     Symmetric, no curvature, ROM normal, no CVA tenderness  Lungs:     Clear to auscultation bilaterally, respirations unlabored  Chest Wall:    No tenderness or deformity   Heart:    Regular rate and rhythm, S1 and S2 normal, no murmur, rub   or gallop     Abdomen:      Soft, non-tender, bowel sounds active all four quadrants,    no masses, no organomegaly, scars  present from prior surgeries   Genitalia:    Normal female without lesion, discharge or tenderness, the vaginal vault is somewhat narrowed but does permit a small speculum. The cervical os was visualized with some serosanguineous drainage but no clot or active bleeding noted no obvious mass on bimanual examination      Extremities:   Extremities normal, atraumatic, no cyanosis or edema  Pulses:   2+ and symmetric all extremities  Skin:   Skin color, texture, turgor normal, no rashes or lesions  Lymph nodes:   Cervical, supraclavicular, and axillary nodes normal  Neurologic:   normal strength         ECOG = 1  1 - Symptomatic but completely ambulatory (Restricted in physically  strenuous activity but ambulatory and able to carry out work of a light or sedentary nature. For example, light housework, office work)   Lab Results  Component Value Date   WBC 6.4 07/23/2013   HGB 12.0* 07/23/2013   HCT 36.5* 07/23/2013   MCV 89.5 07/23/2013   PLT 131* 01/18/2013   Lab Results  Component Value Date   NA 135 07/23/2013   K 4.0 07/23/2013   CL 95* 07/23/2013   CO2 27 07/23/2013   Lab Results  Component Value Date   ALT 12 06/27/2013   AST 20 06/27/2013   ALKPHOS 73 06/27/2013   BILITOT 0.4 06/27/2013     RADIOGRAPHY: Pre-biopsy CT scan of the abdomen and pelvis showing a distended endometrial cavity with fluid/hemorrhage, suggesting underlying cervical stenosis,  no mass within the pelvis lymphadenopathy or spread into the abdominal area    IMPRESSION:  Endometrial clear cell adenocarcinoma in a setting of prior pelvic radiation therapy for rectal cancer.   the patient has had complications related to her prior malignancy,  surgery and postoperative radiation treatment.  I discussed with the patient that her best chances for cure would be to proceed with surgery. She appears to understand this issue but  again is hesitant to consider surgery in light of her age and other above-mentioned issues. She would not be a candidate for further external beam radiation therapy as part of her management in light of prior radiation therapy.   I reviewed the patient's preoperative CT scan which showed a distended endometrial cavity with fluid/hemorrhage. With this extent of distention intracavitary brachytherapy would be suboptimal in covering the endometrial area.  I anticipate that after her endometrial biopsies some of this fluid may have drained out through the vaginal region. The patient did undergo a limited pelvic CT scan in our department today which did show the uterus to be somewhat smaller with some residual fluid present.  With further fluid drainage the patient may be a candidate for intracavitary brachytherapy treatments but again I feel this is suboptimal treatment compared to surgery.  I discussed 2 general options that being high-dose-rate treatment and low dose rate intracavitary brachytherapy treatments. In addition with a tandem HDR system I am unsure whether the 60 mm length would adequately cover the endometrial cavity.  Patient may be better served with low dose rate intracavitary treatment using cesium.  the patient would prefer to be hospitalized and to proceed with one or 2 brachytherapy treatments rather than 5 HDR treatments. I will speak with Dr. Stanford Breed concerning this issue and to arrange for consultation with radiation oncology at [facility]. I spent 60 minutes minutes face to face with the patient and more than 50% of that time was spent in counseling and/or coordination of care.   ------------------------------------------------  -----------------------------------  Billie Lade, PhD, MD

## 2013-08-29 NOTE — Progress Notes (Signed)
Please see the Nurse Progress Note in the MD Initial Consult Encounter for this patient. 

## 2013-09-27 ENCOUNTER — Ambulatory Visit: Payer: Medicare Other

## 2013-10-02 ENCOUNTER — Ambulatory Visit: Payer: Medicare Other

## 2013-10-02 ENCOUNTER — Telehealth: Payer: Self-pay | Admitting: Family Medicine

## 2013-10-02 NOTE — Telephone Encounter (Signed)
Spoke with pts daughter appt scheduled.  ?

## 2013-10-03 ENCOUNTER — Ambulatory Visit (INDEPENDENT_AMBULATORY_CARE_PROVIDER_SITE_OTHER): Payer: Medicare Other | Admitting: Family Medicine

## 2013-10-03 ENCOUNTER — Encounter: Payer: Self-pay | Admitting: Family Medicine

## 2013-10-03 ENCOUNTER — Telehealth: Payer: Self-pay | Admitting: Family Medicine

## 2013-10-03 ENCOUNTER — Ambulatory Visit: Payer: Medicare Other | Admitting: Family Medicine

## 2013-10-03 VITALS — BP 121/68 | HR 71 | Temp 98.4°F | Ht 64.0 in | Wt 124.0 lb

## 2013-10-03 DIAGNOSIS — C541 Malignant neoplasm of endometrium: Secondary | ICD-10-CM

## 2013-10-03 DIAGNOSIS — C549 Malignant neoplasm of corpus uteri, unspecified: Secondary | ICD-10-CM

## 2013-10-03 DIAGNOSIS — N950 Postmenopausal bleeding: Secondary | ICD-10-CM

## 2013-10-03 DIAGNOSIS — E538 Deficiency of other specified B group vitamins: Secondary | ICD-10-CM

## 2013-10-03 DIAGNOSIS — I4891 Unspecified atrial fibrillation: Secondary | ICD-10-CM

## 2013-10-03 DIAGNOSIS — R609 Edema, unspecified: Secondary | ICD-10-CM

## 2013-10-03 DIAGNOSIS — E559 Vitamin D deficiency, unspecified: Secondary | ICD-10-CM

## 2013-10-03 MED ORDER — POTASSIUM CHLORIDE ER 10 MEQ PO TBCR: 10.0000 meq | EXTENDED_RELEASE_TABLET | Freq: Every day | ORAL | Status: DC

## 2013-10-03 MED ORDER — FUROSEMIDE 20 MG PO TABS: 20.0000 mg | ORAL_TABLET | Freq: Every day | ORAL | Status: DC

## 2013-10-03 NOTE — Addendum Note (Signed)
Addended by: Pollyann Kennedy F on: 10/03/2013 12:37 PM   Modules accepted: Orders

## 2013-10-03 NOTE — Progress Notes (Signed)
Subjective:    Patient ID: [name] [name], female    DOB: November 26, 1923, 80 y.o.   MRN: [id number]  HPI Patient here today for edema of feet and legs, the right worse than the left. Patient has some post menopausal vaginal bleeding and it was found that she has endometrial cancer.  The edema in her legs which has been going on for 24 hours. She denies any more shortness of breath than usual. Patient comes to the office today with her daughter. She is scheduled to see a radiation specialist in [facility] for possible internal radiation for endometrial carcinoma. The patient has refused to have a hysterectomy.      Patient Active Problem List   Diagnosis Date Noted  . Colon cancer   . Endometrial cancer 08/20/2013  . Postmenopausal bleeding 08/07/2013  . Chronic diarrhea, secondary to short bowel syndrome 06/27/2013  . Vitamin D deficiency 06/27/2013  . Atrial fibrillation, history of 12/22/2010  . Leg edema 12/22/2010   Outpatient Encounter Prescriptions as of 10/03/2013  Medication Sig  . aspirin EC 81 MG tablet Take 81 mg by mouth daily.  . bifidobacterium infantis (ALIGN) capsule Take 1 capsule by mouth daily.  Marland Kitchen BIOTIN PO Take 1 tablet by mouth daily.  . Calcium Carb-Cholecalciferol (CALCIUM 500 +D) 500-400 MG-UNIT TABS Take 1 tablet by mouth 2 (two) times daily.  . cholecalciferol (VITAMIN D) 1000 UNITS tablet Take 1,000 Units by mouth daily.  . cyanocobalamin (,VITAMIN B-12,) 1000 MCG/ML injection Inject 1,000 mcg into the muscle every 30 (thirty) days.  . ferrous sulfate 325 (65 FE) MG tablet Take 1 tablet (325 mg total) by mouth daily with breakfast.  . GLUCOSAMINE PO Take 1 tablet by mouth daily.  . isosorbide mononitrate (IMDUR) 30 MG 24 hr tablet Take 1 tablet (30 mg total) by mouth daily.  Marland Kitchen loperamide (IMODIUM A-D) 2 MG tablet Take 6 mg by mouth 4 (four) times daily. "Short bowel" syndrome.  . medium chain triglycerides (MCT OIL) oil Take 15 mLs by mouth 3 (three) times  daily. Mix in glass with liquid.   For diarrhea and digestion help.  . metoprolol succinate (TOPROL-XL) 12.5 mg TB24 24 hr tablet Take 0.5 tablets (12.5 mg total) by mouth daily.  . Multiple Vitamin (MULTIVITAMIN WITH MINERALS) TABS Take 1 tablet by mouth daily.  . pantoprazole (PROTONIX) 40 MG tablet Take 1 tablet (40 mg total) by mouth daily.  . solifenacin (VESICARE) 10 MG tablet Take 1 tablet (10 mg total) by mouth daily.    Review of Systems  Constitutional: Negative.   HENT: Negative.   Eyes: Negative.   Respiratory: Negative.   Cardiovascular: Positive for leg swelling (and feet--- right side worse).  Gastrointestinal: Negative.   Endocrine: Negative.   Genitourinary: Negative.   Musculoskeletal: Negative.   Skin: Negative.   Allergic/Immunologic: Negative.   Neurological: Negative.   Hematological: Negative.   Psychiatric/Behavioral: Negative.        Objective:   Physical Exam  Nursing note and vitals reviewed. Constitutional: She is oriented to person, place, and time. No distress.  Elderly and somewhat frail but pleasant female  HENT:  Head: Normocephalic.  Eyes: Conjunctivae and EOM are normal. Pupils are equal, round, and reactive to light. Right eye exhibits no discharge. Left eye exhibits no discharge. No scleral icterus.  Neck: Normal range of motion. No JVD present.  Cardiovascular: Normal rate and normal heart sounds.  Exam reveals no gallop and no friction rub.   No murmur  heard. Slightly irregular rhythm at 84 per minute  Pulmonary/Chest: Effort normal and breath sounds normal. No respiratory distress. She has no wheezes. She has no rales. She exhibits no tenderness.  Abdominal: Soft. Bowel sounds are normal. She exhibits distension (increased gaseous distentionbut according to family members no worse than usual). She exhibits no mass. There is no tenderness. There is no rebound and no guarding.  Musculoskeletal: Normal range of motion. She exhibits edema and  tenderness.  Patient has 3+ pedal edema on the right and pretibial edema. She has 1+ pretibial edema on the left  Neurological: She is alert and oriented to person, place, and time.  Skin: Skin is warm and dry. No rash noted.  Psychiatric: She has a normal mood and affect. Her behavior is normal. Judgment and thought content normal.   BP 121/68  Pulse 71  Temp(Src) 98.4 F (36.9 C) (Oral)  Ht '5\' 4"'  (1.626 m)  Wt 124 lb (56.246 kg)  BMI 21.27 kg/m2        Assessment & Plan:  1. Vitamin D deficiency - POCT CBC - Vit D  25 hydroxy (rtn osteoporosis monitoring)  2. Postmenopausal bleeding - POCT CBC  3. Endometrial cancer - POCT CBC - US Carotid Duplex Bilateral; Future  4. Atrial fibrillation, history of - POCT CBC - US Carotid Duplex Bilateral; Future  5. Edema - BMP8+EGFR - Hepatic function panel - POCT UA - Microscopic Only - POCT urinalysis dipstick - US Carotid Duplex Bilateral; Future  Take Lasix as directed along with potassium  Patient Instructions  Take Lasix as directed along with a potassium, take it daily for 2 days then every other day for a week Watch sodium intake Get venous Dopplers as planned Repeat BMP in one week Make sure that the people in [place] let us know about the internal radiation  elevate legs as much as possible   Arrie Senate MD

## 2013-10-03 NOTE — Telephone Encounter (Signed)
Patient still coming to appt

## 2013-10-03 NOTE — Patient Instructions (Addendum)
Take Lasix as directed along with a potassium, take it daily for 2 days then every other day for a week Watch sodium intake Get venous Dopplers as planned Repeat BMP in one week Make sure that the people in [place] let us know about the internal radiation  elevate legs as much as possible

## 2013-10-04 ENCOUNTER — Observation Stay (HOSPITAL_COMMUNITY): Admission: AD | Admit: 2013-10-04 | Payer: Self-pay | Source: Ambulatory Visit | Admitting: Internal Medicine

## 2013-10-04 ENCOUNTER — Inpatient Hospital Stay (HOSPITAL_COMMUNITY)
Admission: EM | Admit: 2013-10-04 | Discharge: 2013-10-06 | DRG: 300 | Disposition: A | Discharge status: Home / Self Care | Payer: Medicare Other | Attending: Internal Medicine | Admitting: Internal Medicine

## 2013-10-04 ENCOUNTER — Encounter (HOSPITAL_COMMUNITY): Payer: Self-pay | Admitting: Emergency Medicine

## 2013-10-04 ENCOUNTER — Other Ambulatory Visit: Payer: Self-pay

## 2013-10-04 ENCOUNTER — Ambulatory Visit: Payer: Medicare Other

## 2013-10-04 ENCOUNTER — Ambulatory Visit (HOSPITAL_COMMUNITY)
Admission: RE | Admit: 2013-10-04 | Discharge: 2013-10-04 | Disposition: A | Discharge status: Home / Self Care | Payer: Medicare Other | Source: Ambulatory Visit | Attending: Family Medicine | Admitting: Family Medicine

## 2013-10-04 DIAGNOSIS — R197 Diarrhea, unspecified: Secondary | ICD-10-CM

## 2013-10-04 DIAGNOSIS — I10 Essential (primary) hypertension: Secondary | ICD-10-CM

## 2013-10-04 DIAGNOSIS — C541 Malignant neoplasm of endometrium: Secondary | ICD-10-CM

## 2013-10-04 DIAGNOSIS — I82409 Acute embolism and thrombosis of unspecified deep veins of unspecified lower extremity: Secondary | ICD-10-CM | POA: Diagnosis present

## 2013-10-04 DIAGNOSIS — K529 Noninfective gastroenteritis and colitis, unspecified: Secondary | ICD-10-CM | POA: Diagnosis present

## 2013-10-04 DIAGNOSIS — I82890 Acute embolism and thrombosis of other specified veins: Secondary | ICD-10-CM | POA: Diagnosis present

## 2013-10-04 DIAGNOSIS — K912 Postsurgical malabsorption, not elsewhere classified: Secondary | ICD-10-CM | POA: Diagnosis present

## 2013-10-04 DIAGNOSIS — I824Y9 Acute embolism and thrombosis of unspecified deep veins of unspecified proximal lower extremity: Principal | ICD-10-CM | POA: Diagnosis present

## 2013-10-04 DIAGNOSIS — Z85048 Personal history of other malignant neoplasm of rectum, rectosigmoid junction, and anus: Secondary | ICD-10-CM

## 2013-10-04 DIAGNOSIS — Z8249 Family history of ischemic heart disease and other diseases of the circulatory system: Secondary | ICD-10-CM

## 2013-10-04 DIAGNOSIS — I4891 Unspecified atrial fibrillation: Secondary | ICD-10-CM

## 2013-10-04 DIAGNOSIS — C549 Malignant neoplasm of corpus uteri, unspecified: Secondary | ICD-10-CM

## 2013-10-04 DIAGNOSIS — Z79899 Other long term (current) drug therapy: Secondary | ICD-10-CM

## 2013-10-04 DIAGNOSIS — Z7982 Long term (current) use of aspirin: Secondary | ICD-10-CM

## 2013-10-04 DIAGNOSIS — R609 Edema, unspecified: Secondary | ICD-10-CM

## 2013-10-04 DIAGNOSIS — E538 Deficiency of other specified B group vitamins: Secondary | ICD-10-CM

## 2013-10-04 DIAGNOSIS — Z859 Personal history of malignant neoplasm, unspecified: Secondary | ICD-10-CM

## 2013-10-04 DIAGNOSIS — O2230 Deep phlebothrombosis in pregnancy, unspecified trimester: Secondary | ICD-10-CM | POA: Diagnosis present

## 2013-10-04 DIAGNOSIS — Z9049 Acquired absence of other specified parts of digestive tract: Secondary | ICD-10-CM

## 2013-10-04 LAB — HEPATIC FUNCTION PANEL
ALK PHOS: 83 IU/L (ref 39–117)
ALT: 12 IU/L (ref 0–32)
AST: 25 IU/L (ref 0–40)
Albumin: 3 g/dL — ABNORMAL LOW (ref 3.5–4.7)
BILIRUBIN DIRECT: 0.15 mg/dL (ref 0.00–0.40)
BILIRUBIN TOTAL: 0.4 mg/dL (ref 0.0–1.2)
TOTAL PROTEIN: 5.6 g/dL — AB (ref 6.0–8.5)

## 2013-10-04 LAB — CBC WITH DIFFERENTIAL/PLATELET
BASOS PCT: 0 % (ref 0–1)
Basophils Absolute: 0 10*3/uL (ref 0.0–0.1)
Eosinophils Absolute: 0.1 10*3/uL (ref 0.0–0.7)
Eosinophils Relative: 1 % (ref 0–5)
HCT: 38.5 % (ref 36.0–46.0)
Hemoglobin: 12.7 g/dL (ref 12.0–15.0)
LYMPHS ABS: 0.7 10*3/uL (ref 0.7–4.0)
Lymphocytes Relative: 10 % — ABNORMAL LOW (ref 12–46)
MCH: 29.4 pg (ref 26.0–34.0)
MCHC: 33 g/dL (ref 30.0–36.0)
MCV: 89.1 fL (ref 78.0–100.0)
MONOS PCT: 8 % (ref 3–12)
Monocytes Absolute: 0.6 10*3/uL (ref 0.1–1.0)
NEUTROS ABS: 5.6 10*3/uL (ref 1.7–7.7)
Neutrophils Relative %: 81 % — ABNORMAL HIGH (ref 43–77)
PLATELETS: 267 10*3/uL (ref 150–400)
RBC: 4.32 MIL/uL (ref 3.87–5.11)
RDW: 13.6 % (ref 11.5–15.5)
WBC: 6.9 10*3/uL (ref 4.0–10.5)

## 2013-10-04 LAB — CBC WITH DIFFERENTIAL
BASOS ABS: 0 10*3/uL (ref 0.0–0.2)
Basos: 0 %
Eos: 1 %
Eosinophils Absolute: 0 10*3/uL (ref 0.0–0.4)
HCT: 36.4 % (ref 34.0–46.6)
Hemoglobin: 12.1 g/dL (ref 11.1–15.9)
IMMATURE GRANS (ABS): 0 10*3/uL (ref 0.0–0.1)
IMMATURE GRANULOCYTES: 0 %
Lymphocytes Absolute: 0.9 10*3/uL (ref 0.7–3.1)
Lymphs: 14 %
MCH: 29 pg (ref 26.6–33.0)
MCHC: 33.2 g/dL (ref 31.5–35.7)
MCV: 87 fL (ref 79–97)
Monocytes Absolute: 0.4 10*3/uL (ref 0.1–0.9)
Monocytes: 7 %
Neutrophils Absolute: 4.6 10*3/uL (ref 1.4–7.0)
Neutrophils Relative %: 78 %
PLATELETS: 310 10*3/uL (ref 150–379)
RBC: 4.17 x10E6/uL (ref 3.77–5.28)
RDW: 13.8 % (ref 12.3–15.4)
WBC: 5.9 10*3/uL (ref 3.4–10.8)

## 2013-10-04 LAB — BMP8+EGFR
BUN/Creatinine Ratio: 25 (ref 11–26)
BUN: 18 mg/dL (ref 8–27)
CALCIUM: 9.7 mg/dL (ref 8.6–10.2)
CO2: 27 mmol/L (ref 18–29)
CREATININE: 0.71 mg/dL (ref 0.57–1.00)
Chloride: 99 mmol/L (ref 97–108)
GFR, EST AFRICAN AMERICAN: 89 mL/min/{1.73_m2} (ref >59)
GFR, EST NON AFRICAN AMERICAN: 77 mL/min/{1.73_m2} (ref >59)
Glucose: 92 mg/dL (ref 65–99)
POTASSIUM: 4.5 mmol/L (ref 3.5–5.2)
Sodium: 142 mmol/L (ref 134–144)

## 2013-10-04 LAB — BASIC METABOLIC PANEL
BUN: 20 mg/dL (ref 6–23)
CO2: 27 mEq/L (ref 19–32)
Calcium: 9.4 mg/dL (ref 8.4–10.5)
Chloride: 101 mEq/L (ref 96–112)
Creatinine, Ser: 0.73 mg/dL (ref 0.50–1.10)
GFR calc Af Amer: 87 mL/min — ABNORMAL LOW (ref >90)
GFR, EST NON AFRICAN AMERICAN: 75 mL/min — AB (ref >90)
GLUCOSE: 104 mg/dL — AB (ref 70–99)
Potassium: 3.4 mEq/L — ABNORMAL LOW (ref 3.7–5.3)
SODIUM: 141 meq/L (ref 137–147)

## 2013-10-04 LAB — PROTIME-INR
INR: 1 (ref 0.00–1.49)
Prothrombin Time: 13 seconds (ref 11.6–15.2)

## 2013-10-04 LAB — VITAMIN D 25 HYDROXY (VIT D DEFICIENCY, FRACTURES): Vit D, 25-Hydroxy: 33.7 ng/mL (ref 30.0–100.0)

## 2013-10-04 MED ORDER — CALCIUM CARBONATE-VITAMIN D 500-200 MG-UNIT PO TABS
1.0000 | ORAL_TABLET | Freq: Two times a day (BID) | ORAL | Status: DC
  Administered 2013-10-05 – 2013-10-06 (×3): 1 via ORAL
  Filled 2013-10-04 (×5): qty 1

## 2013-10-04 MED ORDER — ADULT MULTIVITAMIN W/MINERALS CH
1.0000 | ORAL_TABLET | Freq: Every day | ORAL | Status: DC
  Administered 2013-10-05 – 2013-10-06 (×2): 1 via ORAL
  Filled 2013-10-04 (×2): qty 1

## 2013-10-04 MED ORDER — FUROSEMIDE 20 MG PO TABS
20.0000 mg | ORAL_TABLET | Freq: Every day | ORAL | Status: DC
  Administered 2013-10-05 – 2013-10-06 (×2): 20 mg via ORAL
  Filled 2013-10-04 (×2): qty 1

## 2013-10-04 MED ORDER — PANTOPRAZOLE SODIUM 40 MG PO TBEC
40.0000 mg | DELAYED_RELEASE_TABLET | Freq: Every day | ORAL | Status: DC
  Administered 2013-10-05 – 2013-10-06 (×2): 40 mg via ORAL
  Filled 2013-10-04 (×2): qty 1

## 2013-10-04 MED ORDER — SODIUM CHLORIDE 0.9 % IJ SOLN
3.0000 mL | Freq: Two times a day (BID) | INTRAMUSCULAR | Status: DC
  Administered 2013-10-05: 3 mL via INTRAVENOUS

## 2013-10-04 MED ORDER — ONDANSETRON HCL 4 MG PO TABS: 4.0000 mg | ORAL_TABLET | Freq: Four times a day (QID) | ORAL | Status: DC | PRN

## 2013-10-04 MED ORDER — ONDANSETRON HCL 4 MG/2ML IJ SOLN: 4.0000 mg | Freq: Four times a day (QID) | INTRAMUSCULAR | Status: DC | PRN

## 2013-10-04 MED ORDER — ENOXAPARIN SODIUM 150 MG/ML ~~LOC~~ SOLN
1.0000 mg/kg | Freq: Once | SUBCUTANEOUS | Status: DC
  Filled 2013-10-04: qty 1

## 2013-10-04 MED ORDER — ACETAMINOPHEN 325 MG PO TABS: 650.0000 mg | ORAL_TABLET | Freq: Four times a day (QID) | ORAL | Status: DC | PRN

## 2013-10-04 MED ORDER — ENOXAPARIN SODIUM 60 MG/0.6ML ~~LOC~~ SOLN
55.0000 mg | Freq: Once | SUBCUTANEOUS | Status: AC
  Administered 2013-10-04: 22:00:00 55 mg via SUBCUTANEOUS
  Filled 2013-10-04: qty 0.6

## 2013-10-04 MED ORDER — HYDROCODONE-ACETAMINOPHEN 5-325 MG PO TABS: 1.0000 | ORAL_TABLET | ORAL | Status: DC | PRN

## 2013-10-04 MED ORDER — DARIFENACIN HYDROBROMIDE ER 15 MG PO TB24
15.0000 mg | ORAL_TABLET | Freq: Every day | ORAL | Status: DC
  Filled 2013-10-04 (×2): qty 1

## 2013-10-04 MED ORDER — LOPERAMIDE HCL 2 MG PO CAPS
6.0000 mg | ORAL_CAPSULE | Freq: Four times a day (QID) | ORAL | Status: DC
  Administered 2013-10-05 (×3): 6 mg via ORAL
  Filled 2013-10-04 (×9): qty 3

## 2013-10-04 MED ORDER — VITAMIN D3 25 MCG (1000 UNIT) PO TABS
1000.0000 [IU] | ORAL_TABLET | Freq: Every day | ORAL | Status: DC
  Administered 2013-10-05 – 2013-10-06 (×2): 1000 [IU] via ORAL
  Filled 2013-10-04 (×2): qty 1

## 2013-10-04 MED ORDER — CYANOCOBALAMIN 1000 MCG/ML IJ SOLN: 1000.0000 ug | INTRAMUSCULAR | Status: DC

## 2013-10-04 MED ORDER — MEDIUM CHAIN TRIGLYCERIDES PO OIL
15.0000 mL | TOPICAL_OIL | Freq: Three times a day (TID) | ORAL | Status: DC
  Filled 2013-10-04 (×6): qty 15

## 2013-10-04 MED ORDER — METOPROLOL SUCCINATE 12.5 MG HALF TABLET
12.5000 mg | ORAL_TABLET | Freq: Every day | ORAL | Status: DC
  Administered 2013-10-05 – 2013-10-06 (×2): 12.5 mg via ORAL
  Filled 2013-10-04 (×2): qty 1

## 2013-10-04 MED ORDER — ENOXAPARIN SODIUM 150 MG/ML ~~LOC~~ SOLN: 1.0000 mg/kg | Freq: Two times a day (BID) | SUBCUTANEOUS | Status: DC

## 2013-10-04 MED ORDER — RISAQUAD PO CAPS
1.0000 | ORAL_CAPSULE | Freq: Every day | ORAL | Status: DC
  Administered 2013-10-05 – 2013-10-06 (×2): 1 via ORAL
  Filled 2013-10-04 (×2): qty 1

## 2013-10-04 MED ORDER — ISOSORBIDE MONONITRATE ER 30 MG PO TB24
30.0000 mg | ORAL_TABLET | Freq: Every day | ORAL | Status: DC
  Administered 2013-10-05 – 2013-10-06 (×2): 30 mg via ORAL
  Filled 2013-10-04 (×2): qty 1

## 2013-10-04 MED ORDER — MORPHINE SULFATE 2 MG/ML IJ SOLN: 1.0000 mg | INTRAMUSCULAR | Status: DC | PRN

## 2013-10-04 MED ORDER — ENOXAPARIN SODIUM 60 MG/0.6ML ~~LOC~~ SOLN: 1.0000 mg/kg | Freq: Once | SUBCUTANEOUS | Status: DC

## 2013-10-04 MED ORDER — ENOXAPARIN SODIUM 60 MG/0.6ML ~~LOC~~ SOLN
55.0000 mg | Freq: Two times a day (BID) | SUBCUTANEOUS | Status: DC
  Administered 2013-10-05 – 2013-10-06 (×3): 55 mg via SUBCUTANEOUS
  Filled 2013-10-04 (×4): qty 0.6

## 2013-10-04 MED ORDER — ONDANSETRON HCL 4 MG/2ML IJ SOLN: 4.0000 mg | Freq: Three times a day (TID) | INTRAMUSCULAR | Status: DC | PRN

## 2013-10-04 MED ORDER — FERROUS SULFATE 325 (65 FE) MG PO TABS
325.0000 mg | ORAL_TABLET | Freq: Every day | ORAL | Status: DC
  Administered 2013-10-05 – 2013-10-06 (×2): 325 mg via ORAL
  Filled 2013-10-04 (×3): qty 1

## 2013-10-04 MED ORDER — SODIUM CHLORIDE 0.9 % IV SOLN
INTRAVENOUS | Status: DC
  Administered 2013-10-05: 10 mL via INTRAVENOUS

## 2013-10-04 MED ORDER — ACETAMINOPHEN 650 MG RE SUPP: 650.0000 mg | Freq: Four times a day (QID) | RECTAL | Status: DC | PRN

## 2013-10-04 MED ORDER — POTASSIUM CHLORIDE ER 10 MEQ PO TBCR
10.0000 meq | EXTENDED_RELEASE_TABLET | Freq: Every day | ORAL | Status: DC
  Administered 2013-10-05 – 2013-10-06 (×2): 10 meq via ORAL
  Filled 2013-10-04 (×2): qty 1

## 2013-10-04 MED ORDER — ASPIRIN EC 81 MG PO TBEC
81.0000 mg | DELAYED_RELEASE_TABLET | Freq: Every day | ORAL | Status: DC
  Administered 2013-10-05 – 2013-10-06 (×2): 81 mg via ORAL
  Filled 2013-10-04 (×2): qty 1

## 2013-10-04 NOTE — Progress Notes (Signed)
ANTICOAGULATION CONSULT NOTE - Initial Consult  Pharmacy Consult for lovenox Indication: DVT  Allergies  Allergen Reactions  . Celebrex [Celecoxib]   . Meprobamate   . Rofecoxib     Patient Measurements: Height: 5\' 4"  (162.6 cm) Weight: 124 lb (56.246 kg) IBW/kg (Calculated) : 54.7   Vital Signs: Temp: 99.1 F (37.3 C) (01/15 1841) Temp src: Oral (01/15 1841) BP: 148/64 mmHg (01/15 1841) Pulse Rate: 81 (01/15 1841)  Labs:  Recent Labs  10/03/13 1223 10/03/13 1237 10/04/13 2050  HGB  --  12.1 12.7  HCT  --  36.4 38.5  PLT  --  310 267  LABPROT  --   --  13.0  INR  --   --  1.00  CREATININE 0.71  --   --     Estimated Creatinine Clearance: 42.8 ml/min (by C-G formula based on Cr of 0.71).   Medical History: Past Medical History  Diagnosis Date  . Cancer   . Arrhythmia   . Hypertension   . Colon cancer   . Endometrial cancer     Assessment: Patient is an 80 year old female with a past medical history of endometrial cancer and hypertension who presents with 3 DVTs in right lower leg. Lovenox started in ED, will continue as in patient. No previous history of VTE or anticoagulant use. CBC is within normal limits for age, coags normal, renal function ok for bid dosing.  Goal of Therapy:  Anti-Xa level 0.6-1 units/ml 4hrs after LMWH dose given Monitor platelets by anticoagulation protocol: Yes   Plan:  Lovenox 55mg  q12 hours CBC q72 hours  Erin Hearing PharmD., BCPS Clinical Pharmacist Pager [phone] 10/04/2013 9:22 PM

## 2013-10-04 NOTE — ED Notes (Signed)
Pt sent from [facility] for 3 dvts found on [place] at [facility] today. Pt denies cp or sob.

## 2013-10-04 NOTE — ED Provider Notes (Signed)
CSN: [id number]     Arrival date & time 10/04/13  1748 History   First MD Initiated Contact with Patient 10/04/13 2029     Chief Complaint  Patient presents with  . DVT   (Consider location/radiation/quality/duration/timing/severity/associated sxs/prior Treatment) HPI Comments: Patient is an 80 year old female with a past medical history of endometrial cancer and hypertension who presents with 3 DVTs in right lower leg. Patient reports sudden onset of leg swelling last night. Patient saw her PCP, Dr. Redge Gainer in [place], [place] who ordered a DVT study today at [facility]. The study showed 3 DVTs in her right lower leg. Dr. Laurance Flatten spoke with a hospitalist (there is no name on the note) at [facility] for the patient's admission. Patient denies any pain. Patient denies any history of previous DVT or PE. Patient denies chest pain, SOB, leg pain.    Past Medical History  Diagnosis Date  . Cancer   . Arrhythmia   . Hypertension   . Colon cancer   . Endometrial cancer    Past Surgical History  Procedure Laterality Date  . Bowel resection    . Colon cancer    . Appendectomy    . Colectomy N/A    Family History  Problem Relation Age of Onset  . Heart disease Mother   . Heart disease Sister   . Heart disease Brother   . Colon cancer Sister    History  Substance Use Topics  . Smoking status: Never Smoker   . Smokeless tobacco: Not on file  . Alcohol Use: No   OB History   Grav Para Term Preterm Abortions TAB SAB Ect Mult Living                 Review of Systems  Constitutional: Negative for fever, chills and fatigue.  HENT: Negative for trouble swallowing.   Eyes: Negative for visual disturbance.  Respiratory: Negative for shortness of breath.   Cardiovascular: Positive for leg swelling. Negative for chest pain and palpitations.  Gastrointestinal: Negative for nausea, vomiting, abdominal pain and diarrhea.  Genitourinary: Negative for dysuria and difficulty urinating.   Musculoskeletal: Negative for arthralgias and neck pain.  Skin: Negative for color change.  Neurological: Negative for dizziness and weakness.  Psychiatric/Behavioral: Negative for dysphoric mood.    Allergies  Celebrex; Meprobamate; and Rofecoxib  Home Medications   Current Outpatient Rx  Name  Route  Sig  Dispense  Refill  . aspirin EC 81 MG tablet   Oral   Take 81 mg by mouth daily.         . bifidobacterium infantis (ALIGN) capsule   Oral   Take 1 capsule by mouth daily.         Marland Kitchen BIOTIN PO   Oral   Take 1 tablet by mouth daily.         . Calcium Carb-Cholecalciferol (CALCIUM 500 +D) 500-400 MG-UNIT TABS   Oral   Take 1 tablet by mouth 2 (two) times daily.         . cholecalciferol (VITAMIN D) 1000 UNITS tablet   Oral   Take 1,000 Units by mouth daily.         . cyanocobalamin (,VITAMIN B-12,) 1000 MCG/ML injection   Intramuscular   Inject 1,000 mcg into the muscle every 30 (thirty) days.         . ferrous sulfate 325 (65 FE) MG tablet   Oral   Take 1 tablet (325 mg total) by mouth  daily with breakfast.   90 tablet   3   . furosemide (LASIX) 20 MG tablet   Oral   Take 1 tablet (20 mg total) by mouth daily. As directed   30 tablet   3   . GLUCOSAMINE PO   Oral   Take 1 tablet by mouth daily.         . isosorbide mononitrate (IMDUR) 30 MG 24 hr tablet   Oral   Take 1 tablet (30 mg total) by mouth daily.   90 tablet   3   . loperamide (IMODIUM A-D) 2 MG tablet   Oral   Take 6 mg by mouth 4 (four) times daily. "Short bowel" syndrome.   30 tablet   0   . medium chain triglycerides (MCT OIL) oil   Oral   Take 15 mLs by mouth 3 (three) times daily. Mix in glass with liquid.   For diarrhea and digestion help.         . metoprolol succinate (TOPROL-XL) 12.5 mg TB24 24 hr tablet   Oral   Take 0.5 tablets (12.5 mg total) by mouth daily.   90 tablet   3   . Multiple Vitamin (MULTIVITAMIN WITH MINERALS) TABS   Oral   Take 1 tablet  by mouth daily.         . pantoprazole (PROTONIX) 40 MG tablet   Oral   Take 1 tablet (40 mg total) by mouth daily.   90 tablet   3   . potassium chloride (K-DUR) 10 MEQ tablet   Oral   Take 1 tablet (10 mEq total) by mouth daily. As directed   30 tablet   2   . solifenacin (VESICARE) 10 MG tablet   Oral   Take 1 tablet (10 mg total) by mouth daily.   90 tablet   3    BP 148/64  Pulse 81  Temp(Src) 99.1 F (37.3 C) (Oral)  Resp 16  Ht 5\' 4"  (1.626 m)  Wt 124 lb (56.246 kg)  BMI 21.27 kg/m2  SpO2 98% Physical Exam  Nursing note and vitals reviewed. Constitutional: She is oriented to person, place, and time. She appears well-developed and well-nourished. No distress.  HENT:  Head: Normocephalic and atraumatic.  Eyes: Conjunctivae are normal.  Cardiovascular: Normal rate, regular rhythm and intact distal pulses.  Exam reveals no gallop and no friction rub.   No murmur heard. Pulmonary/Chest: Effort normal and breath sounds normal. She has no wheezes. She has no rales. She exhibits no tenderness.  Musculoskeletal: Normal range of motion.  Right lower leg edema without tenderness to palpation of right calf. No left leg swelling or tenderness to palpation of the calf.   Neurological: She is alert and oriented to person, place, and time. Coordination normal.  Speech is goal-oriented. Moves limbs without ataxia.   Skin: Skin is warm and dry.  Psychiatric: She has a normal mood and affect. Her behavior is normal.    ED Course  Procedures (including critical care time) Labs Review Labs Reviewed  CBC WITH DIFFERENTIAL - Abnormal; Notable for the following:    Neutrophils Relative % 81 (*)    Lymphocytes Relative 10 (*)    All other components within normal limits  BASIC METABOLIC PANEL  PROTIME-INR   Imaging Review US Venous Img Lower Bilateral  10/04/2013   CLINICAL DATA:  Endometrial cancer, colon cancer, hypertension, atrial fibrillation, with lower extremity  edema/ swelling question deep venous thrombosis  EXAM: BILATERAL LOWER  EXTREMITY VENOUS DOPPLER ULTRASOUND  TECHNIQUE: Gray-scale sonography with graded compression, as well as color Doppler and duplex ultrasound, were performed to evaluate the deep venous system from the level of the common femoral vein through the popliteal and proximal calf veins. Spectral Doppler was utilized to evaluate flow at rest and with distal augmentation maneuvers.  COMPARISON:  None  FINDINGS: Thrombus within deep veins: Peripheral nonocclusive thrombus identified in the right common femoral vein extending into profunda femoral vein. No thrombus identified within the right femoral or popliteal veins. Minimal nonocclusive thrombus at a right posterior tibial vein. In the left lower extremity, common femoral, profunda femoral, and femoral veins show no thrombus. Peripheral mildly echogenic material within the left popliteal vein is noted, likely nonocclusive thrombus, age-indeterminate.  Compressibility of deep veins: Impaired at the sites of observed thrombus, otherwise normal.  Duplex waveform respiratory phasicity:  Normal.  Duplex waveform response to augmentation:  Normal.  Venous reflux:  None visualized.  Other findings:  Scattered subcutaneous edema in both lower legs.  IMPRESSION: Nonocclusive thrombus within the right common femoral vein extending into right profunda femoral vein, likely acute.  Nonocclusive thrombus within left popliteal and right posterior tibial veins, age-indeterminate.   Electronically Signed   By: Lavonia Dana M.D.   On: 10/04/2013 15:11    EKG Interpretation   None       MDM   1. DVT (deep venous thrombosis)   2. Endometrial cancer     8:48 PM Patient has 3 DVT in right leg that extend into her femoral vein. Patient sent here by her PCP in [place], [place], Dr. Redge Gainer for admission. Vitals stable and patient afebrile. Patient denies chest pain and SOB at this time.   9:11 PM Patient  will have lovenox here, 1 mg/kg and be admitted to Dr. Charlies Silvers.     Alvina Chou, PA-C 10/04/13 2118

## 2013-10-04 NOTE — H&P (Signed)
[facility] History and Physical  [name] [name] OEU:[id number] DOB: 08-28-1924 DOA: 10/04/2013  Referring physician: ER physician PCP: Redge Gainer, MD   Chief Complaint: right lower extremity swelling  HPI:  80 year old female with past medical history significant for rectal adenocarcinoma and most recently endometrial carcinoma, history of atrial fibrillation, hypertension who was seen at [facility] by PCP for worsening right lower extremity swelling, ongoing. Pt was subsequently found to have an extensive DVT in right lower extremity. No reports of fever or chills. She does have LE pain and reports weakness. No abdominal pain, nausea or vomiting. She has chronic diarrhea due to short bowel syndrome. No lightheadedness or loss of consciousness. No chest pain, shortness of breath. No blood in stool or urine.  In ED, her vitals are stable and blood work was unremarkable. As already mentioned her LE doppler revealed extensive DVT;s in RLE. She was given Lovenox in ED.  Assessment and Plan:  Principal Problem:   DVT (deep venous thrombosis), right LE - likely secondary to history of malignancy - Lovenox per pharmacy - of note, please evaluate in am if she is a candidate for anticoagulation ; Lovenox already given in ED and her hemoglobin is WNL on this admission. She does have endometrial carcinoma but also vaginal bleed so if Hgb trends down or if there is a concern for worsening bleed she may not be a candidate for [facility]  Active Problems:   History of atrial fibrillation - rate controlled - continue metoprolol - obtain 12 lead EKG   Chronic diarrhea, secondary to short bowel syndrome - on antidiarrheal at home, will continue per home regimen   Endometrial cancer - needs to be seen in [facility] for radiation therapy in regards to uterine carcinoma; she already has had past history of RT for rectal adenocarcinoma.   HTN (hypertension) - continue metoprolol and Imdur - also on lasix  and potassium supplementation    Radiological Exams on Admission: US Venous Img Lower Bilateral 10/04/2013   IMPRESSION: Nonocclusive thrombus within the right common femoral vein extending into right profunda femoral vein, likely acute.  Nonocclusive thrombus within left popliteal and right posterior tibial veins, age-indeterminate.    Code Status: Full Family Communication: Pt at bedside Disposition Plan: Admit for further evaluation  Leisa Lenz, MD  [facility] Pager [phone]  Review of Systems:  Constitutional: Negative for fever, chills and malaise/fatigue. Negative for diaphoresis.  HENT: Negative for hearing loss, ear pain, nosebleeds, congestion, sore throat, neck pain, tinnitus and ear discharge.   Eyes: Negative for blurred vision, double vision, photophobia, pain, discharge and redness.  Respiratory: Negative for cough, hemoptysis, sputum production, shortness of breath, wheezing and stridor.   Cardiovascular: Negative for chest pain, palpitations, orthopnea, claudication and positive for tight leg swelling.  Gastrointestinal: Negative for nausea, vomiting and abdominal pain. Negative for heartburn, constipation, blood in stool and melena.  Genitourinary: Negative for dysuria, urgency, frequency, hematuria and flank pain.  Musculoskeletal: Negative for myalgias, back pain, joint pain and falls.  Skin: Negative for itching and rash.  Neurological: Negative for dizziness and weakness. Negative for tingling, tremors, sensory change, speech change, focal weakness, loss of consciousness and headaches.  Endo/Heme/Allergies: Negative for environmental allergies and polydipsia. Does not bruise/bleed easily.  Psychiatric/Behavioral: Negative for suicidal ideas. The patient is not nervous/anxious.      Past Medical History  Diagnosis Date  . Cancer   . Arrhythmia   . Hypertension   . Colon cancer   .  Endometrial cancer    Past Surgical History  Procedure Laterality  Date  . Bowel resection    . Colon cancer    . Appendectomy    . Colectomy N/A    Social History:  reports that she has never smoked. She does not have any smokeless tobacco history on file. She reports that she does not drink alcohol or use illicit drugs.  Allergies  Allergen Reactions  . Celebrex [Celecoxib]   . Meprobamate   . Rofecoxib     Family History  Problem Relation Age of Onset  . Heart disease Mother   . Heart disease Sister   . Heart disease Brother   . Colon cancer Sister      Prior to Admission medications   Medication Sig Start Date End Date Taking? Authorizing Provider  aspirin EC 81 MG tablet Take 81 mg by mouth daily.    Historical Provider, MD  bifidobacterium infantis (ALIGN) capsule Take 1 capsule by mouth daily.    Historical Provider, MD  BIOTIN PO Take 1 tablet by mouth daily.    Historical Provider, MD  Calcium Carb-Cholecalciferol (CALCIUM 500 +D) 500-400 MG-UNIT TABS Take 1 tablet by mouth 2 (two) times daily.    Historical Provider, MD  cholecalciferol (VITAMIN D) 1000 UNITS tablet Take 1,000 Units by mouth daily.    Historical Provider, MD  cyanocobalamin (,VITAMIN B-12,) 1000 MCG/ML injection Inject 1,000 mcg into the muscle every 30 (thirty) days.    Historical Provider, MD  ferrous sulfate 325 (65 FE) MG tablet Take 1 tablet (325 mg total) by mouth daily with breakfast. 06/27/13   Chipper Herb, MD  furosemide (LASIX) 20 MG tablet Take 1 tablet (20 mg total) by mouth daily. As directed 10/03/13   Chipper Herb, MD  GLUCOSAMINE PO Take 1 tablet by mouth daily.    Historical Provider, MD  isosorbide mononitrate (IMDUR) 30 MG 24 hr tablet Take 1 tablet (30 mg total) by mouth daily. 06/27/13   Chipper Herb, MD  loperamide (IMODIUM A-D) 2 MG tablet Take 6 mg by mouth 4 (four) times daily. "Short bowel" syndrome. 12/22/10   Thayer Headings, MD  medium chain triglycerides (MCT OIL) oil Take 15 mLs by mouth 3 (three) times daily. Mix in glass with liquid.    For diarrhea and digestion help.    Historical Provider, MD  metoprolol succinate (TOPROL-XL) 12.5 mg TB24 24 hr tablet Take 0.5 tablets (12.5 mg total) by mouth daily. 06/27/13   Chipper Herb, MD  Multiple Vitamin (MULTIVITAMIN WITH MINERALS) TABS Take 1 tablet by mouth daily.    Historical Provider, MD  pantoprazole (PROTONIX) 40 MG tablet Take 1 tablet (40 mg total) by mouth daily. 06/27/13   Chipper Herb, MD  potassium chloride (K-DUR) 10 MEQ tablet Take 1 tablet (10 mEq total) by mouth daily. As directed 10/03/13   Chipper Herb, MD  solifenacin (VESICARE) 10 MG tablet Take 1 tablet (10 mg total) by mouth daily. 06/27/13   Chipper Herb, MD   Physical Exam: Filed Vitals:   10/04/13 1841  BP: 148/64  Pulse: 81  Temp: 99.1 F (37.3 C)  TempSrc: Oral  Resp: 16  Height: 5\' 4"  (1.626 m)  Weight: 56.246 kg (124 lb)  SpO2: 98%    Physical Exam  Constitutional: Appears well-developed and well-nourished. No distress.  HENT: Normocephalic. External right and left ear normal. Oropharynx is clear and moist.  Eyes: Conjunctivae and EOM are normal. PERRLA, no  scleral icterus.  Neck: Normal ROM. Neck supple. No JVD. No tracheal deviation. No thyromegaly.  CVS: RRR, S1/S2 +, no murmurs, no gallops, no carotid bruit.  Pulmonary: Effort and breath sounds normal, no stridor, rhonchi, wheezes, rales.  Abdominal: Soft. BS +,  no distension, tenderness, rebound or guarding.  Musculoskeletal: Normal range of motion. Right lower extremity swelling apprecaited Lymphadenopathy: No lymphadenopathy noted, cervical, inguinal. Neuro: Alert. Normal reflexes, muscle tone coordination. No cranial nerve deficit. Skin: Skin is warm and dry. No rash noted. Not diaphoretic. No erythema. No pallor.  Psychiatric: Normal mood and affect. Behavior, judgment, thought content normal.   Labs on Admission:  Basic Metabolic Panel:  Recent Labs Lab 10/03/13 1223  NA 142  K 4.5  CL 99  CO2 27  GLUCOSE 92  BUN  18  CREATININE 0.71  CALCIUM 9.7   Liver Function Tests:  Recent Labs Lab 10/03/13 1223  AST 25  ALT 12  ALKPHOS 83  BILITOT 0.4  PROT 5.6*   No results found for this basename: LIPASE, AMYLASE,  in the last 168 hours No results found for this basename: AMMONIA,  in the last 168 hours CBC:  Recent Labs Lab 10/03/13 1237 10/04/13 2050  WBC 5.9 6.9  NEUTROABS 4.6 5.6  HGB 12.1 12.7  HCT 36.4 38.5  MCV 87 89.1  PLT 310 267   Cardiac Enzymes: No results found for this basename: CKTOTAL, CKMB, CKMBINDEX, TROPONINI,  in the last 168 hours BNP: No components found with this basename: POCBNP,  CBG: No results found for this basename: GLUCAP,  in the last 168 hours  If 7PM-7AM, please contact night-coverage [email or web address] Password [facility] 10/04/2013, 9:13 PM

## 2013-10-04 NOTE — ED Notes (Signed)
Attempted to call report to 2W RN, was told they will have to call be back.

## 2013-10-04 NOTE — ED Provider Notes (Signed)
Medical screening examination/treatment/procedure(s) were conducted as a shared visit with non-physician practitioner(s) and myself.  I personally evaluated the patient during the encounter.  EKG Interpretation    Date/Time:    Ventricular Rate:    PR Interval:    QRS Duration:   QT Interval:    QTC Calculation:   R Axis:     Text Interpretation:              Patient here with DVT. Sent by PCP with DVT. Hx of endometrial cancer. R leg swollen, good pulses distally, no signs of phlegmasia.  Patient admitted to medicine as her DVT is extensive.   Osvaldo Shipper, MD 10/04/13 [phone]

## 2013-10-05 ENCOUNTER — Other Ambulatory Visit (HOSPITAL_COMMUNITY): Payer: Self-pay

## 2013-10-05 LAB — CBC
HCT: 31.6 % — ABNORMAL LOW (ref 36.0–46.0)
Hemoglobin: 10.3 g/dL — ABNORMAL LOW (ref 12.0–15.0)
MCH: 29.2 pg (ref 26.0–34.0)
MCHC: 32.6 g/dL (ref 30.0–36.0)
MCV: 89.5 fL (ref 78.0–100.0)
PLATELETS: 228 10*3/uL (ref 150–400)
RBC: 3.53 MIL/uL — AB (ref 3.87–5.11)
RDW: 13.7 % (ref 11.5–15.5)
WBC: 4.8 10*3/uL (ref 4.0–10.5)

## 2013-10-05 LAB — CBC WITH DIFFERENTIAL/PLATELET
Basophils Absolute: 0.1 10*3/uL (ref 0.0–0.1)
Basophils Relative: 2 % — ABNORMAL HIGH (ref 0–1)
EOS PCT: 1 % (ref 0–5)
Eosinophils Absolute: 0.1 10*3/uL (ref 0.0–0.7)
HCT: 33.5 % — ABNORMAL LOW (ref 36.0–46.0)
HEMOGLOBIN: 10.7 g/dL — AB (ref 12.0–15.0)
Lymphocytes Relative: 10 % — ABNORMAL LOW (ref 12–46)
Lymphs Abs: 0.6 10*3/uL — ABNORMAL LOW (ref 0.7–4.0)
MCH: 28.8 pg (ref 26.0–34.0)
MCHC: 31.9 g/dL (ref 30.0–36.0)
MCV: 90.1 fL (ref 78.0–100.0)
MONO ABS: 0.6 10*3/uL (ref 0.1–1.0)
MONOS PCT: 9 % (ref 3–12)
NEUTROS ABS: 4.9 10*3/uL (ref 1.7–7.7)
Neutrophils Relative %: 78 % — ABNORMAL HIGH (ref 43–77)
Platelets: 247 10*3/uL (ref 150–400)
RBC: 3.72 MIL/uL — ABNORMAL LOW (ref 3.87–5.11)
RDW: 13.6 % (ref 11.5–15.5)
WBC: 6.3 10*3/uL (ref 4.0–10.5)

## 2013-10-05 LAB — COMPREHENSIVE METABOLIC PANEL
ALBUMIN: 2.2 g/dL — AB (ref 3.5–5.2)
ALT: 8 U/L (ref 0–35)
ALT: 7 U/L (ref 0–35)
AST: 20 U/L (ref 0–37)
AST: 16 U/L (ref 0–37)
Albumin: 2 g/dL — ABNORMAL LOW (ref 3.5–5.2)
Alkaline Phosphatase: 70 U/L (ref 39–117)
Alkaline Phosphatase: 64 U/L (ref 39–117)
BILIRUBIN TOTAL: 0.3 mg/dL (ref 0.3–1.2)
BILIRUBIN TOTAL: 0.4 mg/dL (ref 0.3–1.2)
BUN: 20 mg/dL (ref 6–23)
BUN: 18 mg/dL (ref 6–23)
CALCIUM: 8.7 mg/dL (ref 8.4–10.5)
CHLORIDE: 104 meq/L (ref 96–112)
CO2: 26 meq/L (ref 19–32)
CO2: 28 meq/L (ref 19–32)
CREATININE: 0.66 mg/dL (ref 0.50–1.10)
CREATININE: 0.67 mg/dL (ref 0.50–1.10)
Calcium: 8.5 mg/dL (ref 8.4–10.5)
Chloride: 104 mEq/L (ref 96–112)
GFR calc Af Amer: 89 mL/min — ABNORMAL LOW (ref >90)
GFR calc Af Amer: 89 mL/min — ABNORMAL LOW (ref >90)
GFR calc non Af Amer: 77 mL/min — ABNORMAL LOW (ref >90)
GFR, EST NON AFRICAN AMERICAN: 77 mL/min — AB (ref >90)
GLUCOSE: 86 mg/dL (ref 70–99)
Glucose, Bld: 115 mg/dL — ABNORMAL HIGH (ref 70–99)
Potassium: 4.3 mEq/L (ref 3.7–5.3)
Potassium: 3.4 mEq/L — ABNORMAL LOW (ref 3.7–5.3)
Sodium: 141 mEq/L (ref 137–147)
Sodium: 142 mEq/L (ref 137–147)
Total Protein: 5.4 g/dL — ABNORMAL LOW (ref 6.0–8.3)
Total Protein: 5 g/dL — ABNORMAL LOW (ref 6.0–8.3)

## 2013-10-05 LAB — PROTIME-INR
INR: 1.15 (ref 0.00–1.49)
Prothrombin Time: 14.5 seconds (ref 11.6–15.2)

## 2013-10-05 LAB — GLUCOSE, CAPILLARY: GLUCOSE-CAPILLARY: 120 mg/dL — AB (ref 70–99)

## 2013-10-05 LAB — PHOSPHORUS: PHOSPHORUS: 2.6 mg/dL (ref 2.3–4.6)

## 2013-10-05 LAB — MAGNESIUM: Magnesium: 1.6 mg/dL (ref 1.5–2.5)

## 2013-10-05 LAB — APTT: aPTT: 41 seconds — ABNORMAL HIGH (ref 24–37)

## 2013-10-05 NOTE — Care Management Note (Unsigned)
    Page 1 of 1   10/05/2013     2:45:28 PM   CARE MANAGEMENT NOTE 10/05/2013  Patient:  [name] [name], [name] [name]   Account Number:  [id number]  Date Initiated:  10/05/2013  Documentation initiated by:  Neda Willenbring  Subjective/Objective Assessment:   PT ADM ON 1/15 WITH DVT OF RLE.  PTA, PT LIVES ALONE. DAUGHTER LIVES NEARBY, PER PT.     Action/Plan:   PT USES CANE TO AMBULATE.  NO P.T. RECOMMENDATIONS, PER EVAL 1/16.  WILL FOLLOW FOR DC NEEDS.   Anticipated DC Date:  10/05/2013   Anticipated DC Plan:  [place]  CM consult      Choice offered to / List presented to:             Status of service:  In process, will continue to follow Medicare Important Message given?   (If response is "NO", the following Medicare IM given date fields will be blank) Date Medicare IM given:   Date Additional Medicare IM given:    Discharge Disposition:    Per UR Regulation:  Reviewed for med. necessity/level of care/duration of stay  If discussed at [place] of Stay Meetings, dates discussed:    Comments:  10/05/13 Jere Bostrom,RN,BSN [phone] per rep at [employer]: tier 2 medication $6.00 at preferred pharmacy, at nonpreferred pharmacy $20.00

## 2013-10-05 NOTE — Evaluation (Signed)
Physical Therapy Evaluation Patient Details Name: [name] [name] MRN: [id number] DOB: [birth year] Today's Date: 10/05/2013 Time: 3818-2993 PT Time Calculation (min): 14 min  PT Assessment / Plan / Recommendation History of Present Illness  Adm with RLE DVT  Clinical Impression  Patient evaluated by Physical Therapy with no further acute PT needs identified. All education has been completed and the patient has no further questions.  PT is signing off. Thank you for this referral.     PT Assessment  Patent does not need any further PT services    Follow Up Recommendations  No PT follow up    Does the patient have the potential to tolerate intense rehabilitation      Barriers to Discharge        Equipment Recommendations  None recommended by PT    Recommendations for Other Services     Frequency      Precautions / Restrictions     Pertinent Vitals/Pain Denies pain in RLE; just feels swollen (but better today)      Mobility  Transfers Overall transfer level: Modified independent Equipment used: Straight cane Ambulation/Gait Ambulation/Gait assistance: Supervision;Modified independent (Device/Increase time) Ambulation Distance (Feet): 160 Feet Assistive device: Straight cane Gait Pattern/deviations: Step-through pattern;Decreased stride length Gait velocity: decr General Gait Details: demonstrated safe use of cane    Exercises     PT Diagnosis:    PT Problem List:   PT Treatment Interventions:       PT Goals(Current goals can be found in the care plan section) Acute Rehab PT Goals PT Goal Formulation: No goals set, d/c therapy  Visit Information  Last PT Received On: 10/05/13 Assistance Needed: +1 History of Present Illness: Adm with RLE DVT       Prior Functioning  Home Living Family/patient expects to be discharged to:: Private residence Living Arrangements: Alone Available Help at Discharge: Family;Available PRN/intermittently Type of Home:  House Home Access: Stairs to enter [employer] of Steps: 2 Entrance Stairs-Rails: None Home Layout: One level Home Equipment: Cane - single point Prior Function Level of Independence: Needs assistance Gait / Transfers Assistance Needed: independent inside home; uses straight cane when leaves house; does not drive and daughter assists with groceries, appointments, etc Communication Communication: No difficulties    Cognition  Cognition Arousal/Alertness: Awake/alert Behavior During Therapy: WFL for tasks assessed/performed Overall Cognitive Status: Within Functional Limits for tasks assessed    Extremity/Trunk Assessment Upper Extremity Assessment Upper Extremity Assessment: Overall WFL for tasks assessed Lower Extremity Assessment Lower Extremity Assessment: Overall WFL for tasks assessed Cervical / Trunk Assessment Cervical / Trunk Assessment: Normal   Balance General Comments General comments (skin integrity, edema, etc.): daughter present and reports she appears at her baseline  End of Session PT - End of Session Equipment Utilized During Treatment: Gait belt Activity Tolerance: Patient tolerated treatment well Patient left: in chair;with call bell/phone within reach;with family/visitor present Nurse Communication: Mobility status  GP     [name] [name] 10/05/2013, 11:43 AM Pager [phone]

## 2013-10-05 NOTE — Progress Notes (Signed)
[facility] PROGRESS NOTE  [name] [name] QJJ:[id number] DOB: 04-09-1924 DOA: 10/04/2013 PCP: Redge Gainer, MD  Assessment/Plan: 1. Deep venous thrombosis. Patient with history of malignancy, initially presenting with extremity swelling, found to have an acute nonocclusive thrombosis in the right common femoral vein and an aged indeterminate thrombus in the left popliteal. Will continue Lovenox therapy given her history of underlying malignancy. Her hemoglobin has remained stable at 10.3 from 10.7 on 10/04/2013. She has not had active bleeding since initiating anticoagulation. Repeat lab work in a.m. 2. History of atrial fibrillation. Patient is presently rate controlled, will continue metoprolol 12.5 mg daily. 3. Hypertension. Blood pressures are stable, with the most recent blood pressure 136/75. Continue beta blocker 4. History of endometrial cancer. Presenting with DVT. Started on anticoagulation. May be undergoing radiation therapy at [facility]  Code Status: Full Code Family Communication: I spoke with patient's daughter present at bedside Disposition Plan: Continue anticoagulation, monitor H&H, if she remains stable anticipate discharge in the next 24 hours   HPI/Subjective: Patient is a pleasant 80 year old female with a past medical history of endometrial adenocarcinoma with clear cell features, presented to the emergency room on 10/04/2048 with complaints of right lower extremity swelling. Venous Doppler showing nonocclusive thrombus within the right common femoral vein extending into the right profunda femoral vein appeared to be acute. Also noted was a nonocclusive thrombus within the left popliteal and right posterior tibial veins that was age indeterminate. Patient was started on Lovenox subcutaneous. This morning she denies chest pain or shortness of breath. Tolerating by mouth intake.  Objective: Filed Vitals:   10/05/13 0614  BP: 136/75  Pulse: 73  Temp: 98.6 F (37  C)  Resp: 16    Intake/Output Summary (Last 24 hours) at 10/05/13 1604 Last data filed at 10/05/13 1102  Gross per 24 hour  Intake    480 ml  Output    100 ml  Net    380 ml   Filed Weights   10/04/13 1841 10/04/13 2232 10/05/13 0614  Weight: 56.246 kg (124 lb) 54.5 kg (120 lb 2.4 oz) 54.7 kg (120 lb 9.5 oz)    Exam:   General:  Patient is in no acute distress, sitting comfortably  Cardiovascular: Regular rate rhythm normal S1 is  Respiratory: She has a few basilar crackles, otherwise normal respiratory effort, no wheezing rhonchi or rales, breathing comfortably on room air.  Abdomen: Soft nontender nondistended  Musculoskeletal: She has right 2+ lower extremity pitting edema, left 1+ pitting edema  Data Reviewed: Basic Metabolic Panel:  Recent Labs Lab 10/03/13 1223 10/04/13 2050 10/04/13 2340 10/05/13 0400  NA 142 141 141 142  K 4.5 3.4* 4.3 3.4*  CL 99 101 104 104  CO2 27 27 26 28   GLUCOSE 92 104* 115* 86  BUN 18 20 20 18   CREATININE 0.71 0.73 0.67 0.66  CALCIUM 9.7 9.4 8.7 8.5  MG  --   --  1.6  --   PHOS  --   --  2.6  --    Liver Function Tests:  Recent Labs Lab 10/03/13 1223 10/04/13 2340 10/05/13 0400  AST 25 20 16   ALT 12 8 7   ALKPHOS 83 70 64  BILITOT 0.4 0.3 0.4  PROT 5.6* 5.4* 5.0*  ALBUMIN  --  2.2* 2.0*   No results found for this basename: LIPASE, AMYLASE,  in the last 168 hours No results found for this basename: AMMONIA,  in the last 168 hours CBC:  Recent Labs Lab 10/03/13 1237 10/04/13 2050 10/04/13 2340 10/05/13 0400  WBC 5.9 6.9 6.3 4.8  NEUTROABS 4.6 5.6 4.9  --   HGB 12.1 12.7 10.7* 10.3*  HCT 36.4 38.5 33.5* 31.6*  MCV 87 89.1 90.1 89.5  PLT 310 267 247 228   Cardiac Enzymes: No results found for this basename: CKTOTAL, CKMB, CKMBINDEX, TROPONINI,  in the last 168 hours BNP (last 3 results) No results found for this basename: PROBNP,  in the last 8760 hours CBG:  Recent Labs Lab 10/05/13 0840  GLUCAP  120*    No results found for this or any previous visit (from the past 240 hour(s)).   Studies: US Venous Img Lower Bilateral  10/04/2013   CLINICAL DATA:  Endometrial cancer, colon cancer, hypertension, atrial fibrillation, with lower extremity edema/ swelling question deep venous thrombosis  EXAM: BILATERAL LOWER EXTREMITY VENOUS DOPPLER ULTRASOUND  TECHNIQUE: Gray-scale sonography with graded compression, as well as color Doppler and duplex ultrasound, were performed to evaluate the deep venous system from the level of the common femoral vein through the popliteal and proximal calf veins. Spectral Doppler was utilized to evaluate flow at rest and with distal augmentation maneuvers.  COMPARISON:  None  FINDINGS: Thrombus within deep veins: Peripheral nonocclusive thrombus identified in the right common femoral vein extending into profunda femoral vein. No thrombus identified within the right femoral or popliteal veins. Minimal nonocclusive thrombus at a right posterior tibial vein. In the left lower extremity, common femoral, profunda femoral, and femoral veins show no thrombus. Peripheral mildly echogenic material within the left popliteal vein is noted, likely nonocclusive thrombus, age-indeterminate.  Compressibility of deep veins: Impaired at the sites of observed thrombus, otherwise normal.  Duplex waveform respiratory phasicity:  Normal.  Duplex waveform response to augmentation:  Normal.  Venous reflux:  None visualized.  Other findings:  Scattered subcutaneous edema in both lower legs.  IMPRESSION: Nonocclusive thrombus within the right common femoral vein extending into right profunda femoral vein, likely acute.  Nonocclusive thrombus within left popliteal and right posterior tibial veins, age-indeterminate.   Electronically Signed   By: Lavonia Dana M.D.   On: 10/04/2013 15:11    Scheduled Meds: . acidophilus  1 capsule Oral Daily  . aspirin EC  81 mg Oral Daily  . calcium-vitamin D  1 tablet  Oral BID WC  . cholecalciferol  1,000 Units Oral Daily  . [START ON 10/19/2013] cyanocobalamin  1,000 mcg Intramuscular Q30 days  . darifenacin  15 mg Oral Daily  . enoxaparin (LOVENOX) injection  55 mg Subcutaneous Q12H  . ferrous sulfate  325 mg Oral Q breakfast  . furosemide  20 mg Oral Daily  . isosorbide mononitrate  30 mg Oral Daily  . loperamide  6 mg Oral QID  . medium chain triglycerides  15 mL Oral TID  . metoprolol succinate  12.5 mg Oral Daily  . multivitamin with minerals  1 tablet Oral Daily  . pantoprazole  40 mg Oral Daily  . potassium chloride  10 mEq Oral Daily  . sodium chloride  3 mL Intravenous Q12H   Continuous Infusions: . sodium chloride 10 mL (10/05/13 0020)    Principal Problem:   DVT (deep venous thrombosis) Active Problems:   Atrial fibrillation, history of   Chronic diarrhea, secondary to short bowel syndrome   Endometrial cancer   HTN (hypertension)    Time spent: 30 minutes    Kelvin Cellar  [facility] Pager [phone]. If 7PM-7AM, please contact night-coverage  at [email or web address], password [facility] 10/05/2013, 4:04 PM  LOS: 1 day

## 2013-10-05 NOTE — Evaluation (Signed)
Occupational Therapy Evaluation Patient Details Name: [name] [name] MRN: [id number] DOB: 1923/10/10 Today's Date: 10/05/2013 Time: 9323-5573 OT Time Calculation (min): 14 min  OT Assessment / Plan / Recommendation History of present illness Adm with RLE DVT   Clinical Impression   Pt is at Mod I level with ADLs and ADL mobility. No further acute OT indicated at this time    OT Assessment  Patient does not need any further OT services    Follow Up Recommendations  No OT follow up    Barriers to Discharge  nome    Equipment Recommendations  None recommended by OT    Recommendations for Other Services    Frequency       Precautions / Restrictions Precautions Precautions: None Restrictions Weight Bearing Restrictions: No   Pertinent Vitals/Pain No c/o pain    ADL  Grooming: Performed;Modified independent Upper Body Bathing: Simulated;Modified independent Lower Body Bathing: Simulated;Modified independent Upper Body Dressing: Performed;Modified independent Lower Body Dressing: Performed;Modified independent Toilet Transfer: Performed;Modified independent Toilet Transfer Method: Sit to Loss adjuster, chartered: Regular height toilet;Grab bars Toileting - Clothing Manipulation and Hygiene: Modified independent Where Assessed - Best boy and Hygiene: Standing Tub/Shower Transfer: Modified independent;Performed Tub/Shower Transfer Method: Therapist, art: Grab bars;Walk in shower Equipment Used: Gait belt    OT Diagnosis:    OT Problem List:   OT Treatment Interventions:     OT Goals(Current goals can be found in the care plan section) Acute Rehab OT Goals Patient Stated Goal: to go home  Visit Information  Last OT Received On: 10/05/13 Assistance Needed: +1 History of Present Illness: Adm with RLE DVT       Prior [place] expects to be discharged to:: Private  residence Living Arrangements: Alone Available Help at Discharge: Family;Available PRN/intermittently Type of Home: House Home Access: Stairs to enter [employer] of Steps: 2 Entrance Stairs-Rails: None Home Layout: One level Home Equipment: Cane - single point;Grab bars - toilet Prior Function Level of Independence: Needs assistance Gait / Transfers Assistance Needed: independent inside home; uses straight cane when leaves house; does not drive and daughter assists with groceries, appointments, etc Communication Communication: No difficulties Dominant Hand: Right         Vision/Perception Vision - History Baseline Vision: Wears glasses only for reading Patient Visual Report: No change from baseline Perception Perception: Within Functional Limits   Cognition  Cognition Arousal/Alertness: Awake/alert Behavior During Therapy: WFL for tasks assessed/performed Overall Cognitive Status: Within Functional Limits for tasks assessed    Extremity/Trunk Assessment Upper Extremity Assessment Upper Extremity Assessment: Overall WFL for tasks assessed Lower Extremity Assessment Lower Extremity Assessment: Defer to PT evaluation Cervical / Trunk Assessment Cervical / Trunk Assessment: Normal     Mobility Bed Mobility Overal bed mobility: Modified Independent Transfers Overall transfer level: Modified independent Equipment used: Straight cane     Exercise     Balance Balance Overall balance assessment: No apparent balance deficits (not formally assessed) General Comments General comments (skin integrity, edema, etc.): daughter present and reports she appears at her baseline   End of Session OT - End of Session Equipment Utilized During Treatment: Gait belt Activity Tolerance: Patient tolerated treatment well Patient left: in chair;with call bell/phone within reach  GO     Britt Bottom 10/05/2013, 2:44 PM

## 2013-10-06 DIAGNOSIS — I10 Essential (primary) hypertension: Secondary | ICD-10-CM

## 2013-10-06 LAB — CBC
HCT: 32.5 % — ABNORMAL LOW (ref 36.0–46.0)
Hemoglobin: 10.5 g/dL — ABNORMAL LOW (ref 12.0–15.0)
MCH: 29 pg (ref 26.0–34.0)
MCHC: 32.3 g/dL (ref 30.0–36.0)
MCV: 89.8 fL (ref 78.0–100.0)
PLATELETS: 239 10*3/uL (ref 150–400)
RBC: 3.62 MIL/uL — ABNORMAL LOW (ref 3.87–5.11)
RDW: 13.7 % (ref 11.5–15.5)
WBC: 5.4 10*3/uL (ref 4.0–10.5)

## 2013-10-06 MED ORDER — ENOXAPARIN (LOVENOX) PATIENT EDUCATION KIT
PACK | Freq: Once | Status: DC
  Filled 2013-10-06: qty 1

## 2013-10-06 MED ORDER — ENOXAPARIN SODIUM 60 MG/0.6ML ~~LOC~~ SOLN: 55.0000 mg | Freq: Two times a day (BID) | SUBCUTANEOUS | Status: AC

## 2013-10-06 NOTE — Discharge Summary (Signed)
Physician Discharge Summary  [name] [name] HWE:[id number] DOB: 1924-06-16 DOA: 10/04/2013  PCP: Redge Gainer, MD  Admit date: 10/04/2013 Discharge date: 10/06/2013  Time spent: 35 minutes  Recommendations for Outpatient Follow-up:  1. Please followup on a CBC on hospital followup visit, she was started on Lovenox therapy for DVT during this hospitalization  Discharge Diagnoses:  Principal Problem:   DVT (deep venous thrombosis) Active Problems:   Atrial fibrillation, history of   Chronic diarrhea, secondary to short bowel syndrome   Endometrial cancer   HTN (hypertension)   Discharge Condition: Stable  Diet recommendation: Regular diet  Filed Weights   10/04/13 2232 10/05/13 0614 10/06/13 0452  Weight: 54.5 kg (120 lb 2.4 oz) 54.7 kg (120 lb 9.5 oz) 55.6 kg (122 lb 9.2 oz)    History of present illness:  80 year old female with past medical history significant for rectal adenocarcinoma and most recently endometrial carcinoma, history of atrial fibrillation, hypertension who was seen at [facility] by PCP for worsening right lower extremity swelling, ongoing. Pt was subsequently found to have an extensive DVT in right lower extremity. No reports of fever or chills. She does have LE pain and reports weakness. No abdominal pain, nausea or vomiting. She has chronic diarrhea due to short bowel syndrome. No lightheadedness or loss of consciousness. No chest pain, shortness of breath. No blood in stool or urine.  In ED, her vitals are stable and blood work was unremarkable. As already mentioned her LE doppler revealed extensive DVT;s in RLE. She was given Lovenox in ED.  Hospital Course:  Patient is a pleasant 80 year old female with a past medical history of endometrial adenocarcinoma with clear cell features who presented to the emergency department on 10/04/2013 with complaints of right lower extremity swelling. Venous Doppler showed nonocclusive thrombus within the right common femoral vein  extending into the right profunda femoral vein appeared to be acute. There was also a nonocclusive thrombus within the left popliteal and right posterior tibial veins. Patient was started on subcutaneous Lovenox. Given her underlying malignancy, she was continued on subcutaneous Lovenox. During this hospitalization she did not complain of chest pain, shortness of breath, fevers chills nausea or vomiting. Her lab work showed stable hemoglobin, being 10.5 on the day of discharge. She did not have clinical evidence of a bleed while on anticoagulation. Overall she remained stable and felt well enough to be discharged on 10/06/2013. Prior to her discharge her and her daughter were given Lovenox administration teaching.   Discharge Exam: Filed Vitals:   10/06/13 0452  BP: 172/64  Pulse: 80  Temp: 98.8 F (37.1 C)  Resp: 17    General: She is awake alert, in good spirits Cardiovascular: Regular rate and rhythm normal S1-S2 Respiratory: Lungs are clear to auscultation bilaterally no wheezing rhonchi or rales Abdomen soft nontender nondistended Extremities. She has 2+ right lower extremity pitting edema, 1+ left lower extremity pitting edema  Discharge Instructions  Discharge Orders   Future Appointments Provider Department Dept Phone   10/22/2013 10:00 AM Chipper Herb, MD [place] [phone]   Future Orders Complete By Expires   Call MD for:  difficulty breathing, headache or visual disturbances  As directed    Call MD for:  extreme fatigue  As directed    Call MD for:  hives  As directed    Call MD for:  persistant dizziness or light-headedness  As directed    Call MD for:  persistant nausea and vomiting  As directed    Call MD for:  severe uncontrolled pain  As directed    Call MD for:  temperature >100.4  As directed    Diet - low sodium heart healthy  As directed    Discharge instructions  As directed    Comments:     Please follow up with your doctor in 5-7  days   Increase activity slowly  As directed        Medication List    STOP taking these medications       aspirin EC 81 MG tablet      TAKE these medications       bifidobacterium infantis capsule  Take 1 capsule by mouth daily.     BIOTIN PO  Take 1 tablet by mouth daily.     CALCIUM 500 +D 500-400 MG-UNIT Tabs  Generic drug:  Calcium Carb-Cholecalciferol  Take 1 tablet by mouth 2 (two) times daily.     cholecalciferol 1000 UNITS tablet  Commonly known as:  VITAMIN D  Take 1,000 Units by mouth daily.     cyanocobalamin 1000 MCG/ML injection  Commonly known as:  (VITAMIN B-12)  Inject 1,000 mcg into the muscle every 30 (thirty) days.     enoxaparin 60 MG/0.6ML injection  Commonly known as:  LOVENOX  Inject 0.55 mLs (55 mg total) into the skin every 12 (twelve) hours.     ferrous sulfate 325 (65 FE) MG tablet  Take 1 tablet (325 mg total) by mouth daily with breakfast.     furosemide 20 MG tablet  Commonly known as:  LASIX  Take 1 tablet (20 mg total) by mouth daily. As directed     GLUCOSAMINE PO  Take 1 tablet by mouth daily.     IMODIUM A-D 2 MG tablet  Generic drug:  loperamide  Take 6 mg by mouth 4 (four) times daily. "Short bowel" syndrome.     isosorbide mononitrate 30 MG 24 hr tablet  Commonly known as:  IMDUR  Take 1 tablet (30 mg total) by mouth daily.     medium chain triglycerides oil  Commonly known as:  MCT OIL  Take 15 mLs by mouth 3 (three) times daily. Mix in glass with liquid.   For diarrhea and digestion help.     metoprolol succinate 12.5 mg Tb24 24 hr tablet  Commonly known as:  TOPROL-XL  Take 0.5 tablets (12.5 mg total) by mouth daily.     multivitamin with minerals Tabs tablet  Take 1 tablet by mouth daily.     pantoprazole 40 MG tablet  Commonly known as:  PROTONIX  Take 1 tablet (40 mg total) by mouth daily.     potassium chloride 10 MEQ tablet  Commonly known as:  K-DUR  Take 1 tablet (10 mEq total) by mouth daily. As  directed     solifenacin 10 MG tablet  Commonly known as:  VESICARE  Take 10 mg by mouth at bedtime.       Allergies  Allergen Reactions  . Celebrex [Celecoxib]   . Meprobamate   . Rofecoxib        Follow-up Information   Follow up with Rudi Heap, MD In 1 week.   Specialty:  Family Medicine   Contact information:   [address] [place] [address] [phone]        The results of significant diagnostics from this hospitalization (including imaging, microbiology, ancillary and laboratory) are listed below for reference.  Significant Diagnostic Studies: US Venous Img Lower Bilateral  10/04/2013   CLINICAL DATA:  Endometrial cancer, colon cancer, hypertension, atrial fibrillation, with lower extremity edema/ swelling question deep venous thrombosis  EXAM: BILATERAL LOWER EXTREMITY VENOUS DOPPLER ULTRASOUND  TECHNIQUE: Gray-scale sonography with graded compression, as well as color Doppler and duplex ultrasound, were performed to evaluate the deep venous system from the level of the common femoral vein through the popliteal and proximal calf veins. Spectral Doppler was utilized to evaluate flow at rest and with distal augmentation maneuvers.  COMPARISON:  None  FINDINGS: Thrombus within deep veins: Peripheral nonocclusive thrombus identified in the right common femoral vein extending into profunda femoral vein. No thrombus identified within the right femoral or popliteal veins. Minimal nonocclusive thrombus at a right posterior tibial vein. In the left lower extremity, common femoral, profunda femoral, and femoral veins show no thrombus. Peripheral mildly echogenic material within the left popliteal vein is noted, likely nonocclusive thrombus, age-indeterminate.  Compressibility of deep veins: Impaired at the sites of observed thrombus, otherwise normal.  Duplex waveform respiratory phasicity:  Normal.  Duplex waveform response to augmentation:  Normal.  Venous reflux:  None  visualized.  Other findings:  Scattered subcutaneous edema in both lower legs.  IMPRESSION: Nonocclusive thrombus within the right common femoral vein extending into right profunda femoral vein, likely acute.  Nonocclusive thrombus within left popliteal and right posterior tibial veins, age-indeterminate.   Electronically Signed   By: Lavonia Dana M.D.   On: 10/04/2013 15:11    Microbiology: No results found for this or any previous visit (from the past 240 hour(s)).   Labs: Basic Metabolic Panel:  Recent Labs Lab 10/03/13 1223 10/04/13 2050 10/04/13 2340 10/05/13 0400  NA 142 141 141 142  K 4.5 3.4* 4.3 3.4*  CL 99 101 104 104  CO2 27 27 26 28   GLUCOSE 92 104* 115* 86  BUN 18 20 20 18   CREATININE 0.71 0.73 0.67 0.66  CALCIUM 9.7 9.4 8.7 8.5  MG  --   --  1.6  --   PHOS  --   --  2.6  --    Liver Function Tests:  Recent Labs Lab 10/03/13 1223 10/04/13 2340 10/05/13 0400  AST 25 20 16   ALT 12 8 7   ALKPHOS 83 70 64  BILITOT 0.4 0.3 0.4  PROT 5.6* 5.4* 5.0*  ALBUMIN  --  2.2* 2.0*   No results found for this basename: LIPASE, AMYLASE,  in the last 168 hours No results found for this basename: AMMONIA,  in the last 168 hours CBC:  Recent Labs Lab 10/03/13 1237 10/04/13 2050 10/04/13 2340 10/05/13 0400 10/06/13 0354  WBC 5.9 6.9 6.3 4.8 5.4  NEUTROABS 4.6 5.6 4.9  --   --   HGB 12.1 12.7 10.7* 10.3* 10.5*  HCT 36.4 38.5 33.5* 31.6* 32.5*  MCV 87 89.1 90.1 89.5 89.8  PLT 310 267 247 228 239   Cardiac Enzymes: No results found for this basename: CKTOTAL, CKMB, CKMBINDEX, TROPONINI,  in the last 168 hours BNP: BNP (last 3 results) No results found for this basename: PROBNP,  in the last 8760 hours CBG:  Recent Labs Lab 10/05/13 0840  GLUCAP 120*       Signed:  Maguire Killmer  [facility] 10/06/2013, 12:32 PM

## 2013-10-10 ENCOUNTER — Telehealth: Payer: Self-pay | Admitting: Family Medicine

## 2013-10-16 ENCOUNTER — Encounter: Payer: Self-pay | Admitting: Family Medicine

## 2013-10-18 NOTE — Progress Notes (Signed)
Our office staff received information from the patient's family that they have been waiting since discharge for the outpatient pharmacy ([facility] [phone]) to obtain preauthorization for the Lovenox prescribed at discharge. Our office did confirm that unfortunately that pharmacy did not have the correct fax number for our office. Chart reviewed and it was noted patient was started on twice a day Lovenox for malignancy related lower extremity DVT. She was discharged 10/06/2013. Case management documentation from 10/05/2013 states that per the representative at [facility] the patient's Lovenox is a Tier 2 medication $6 @ preferred pharmacy and $20 at non-preferred pharmacy.  After lengthy telephone encounter with [facility] it was determined that they only reimburse for the generic form of Lovenox and this needed a formulary exception before could be prescribed. The clinical information information and rationale for use of twice a day Lovenox was given to the representative and the pre-approval process was initiated. The representative informed me that it would take at least 1-2 days to [employer]. They were unable to tell me what the actual cost to the patient would be. The patient's prescription plan number is [id number].  I subsequently contacted the patient's primary care physician's office and explain the above situation to them emphasize in my concern that the patient had not scheduled outpatient followup with them at one week as was instructed to do at time of discharge. I requested they contact the patient and arrange for outpatient physician follow up either tomorrow or Monday at their office. They informed me they would call the patient's daughter with an urgent appointment time. In addition our office staff will be contacting the patient's daughter to make her aware that it will take at least 1-2 days to preauthorize the generic form of Lovenox.   We did speak to the patient's  daughter who did confirm that she has been able to obtain the generic Lovenox OOP and has been giving this to the patient since discharge.  Erin Hearing, ANP

## 2013-10-22 ENCOUNTER — Encounter: Payer: Self-pay | Admitting: Family Medicine

## 2013-10-22 ENCOUNTER — Ambulatory Visit (INDEPENDENT_AMBULATORY_CARE_PROVIDER_SITE_OTHER): Payer: Medicare Other | Admitting: Family Medicine

## 2013-10-22 ENCOUNTER — Ambulatory Visit (INDEPENDENT_AMBULATORY_CARE_PROVIDER_SITE_OTHER): Payer: Medicare Other

## 2013-10-22 VITALS — BP 117/67 | HR 81 | Temp 99.8°F | Ht 64.0 in | Wt 132.0 lb

## 2013-10-22 DIAGNOSIS — R609 Edema, unspecified: Secondary | ICD-10-CM

## 2013-10-22 DIAGNOSIS — I4891 Unspecified atrial fibrillation: Secondary | ICD-10-CM

## 2013-10-22 DIAGNOSIS — I10 Essential (primary) hypertension: Secondary | ICD-10-CM

## 2013-10-22 DIAGNOSIS — I82409 Acute embolism and thrombosis of unspecified deep veins of unspecified lower extremity: Secondary | ICD-10-CM

## 2013-10-22 DIAGNOSIS — C541 Malignant neoplasm of endometrium: Secondary | ICD-10-CM

## 2013-10-22 DIAGNOSIS — E559 Vitamin D deficiency, unspecified: Secondary | ICD-10-CM

## 2013-10-22 DIAGNOSIS — C189 Malignant neoplasm of colon, unspecified: Secondary | ICD-10-CM

## 2013-10-22 DIAGNOSIS — C549 Malignant neoplasm of corpus uteri, unspecified: Secondary | ICD-10-CM

## 2013-10-22 MED ORDER — WARFARIN SODIUM 5 MG PO TABS: 5.0000 mg | ORAL_TABLET | Freq: Every day | ORAL | Status: DC

## 2013-10-22 NOTE — Progress Notes (Signed)
Subjective:    Patient ID: [name] [name], female    DOB: September 13, 1924, 80 y.o.   MRN: [id number]  HPI Pt here for follow up and management of chronic medical problems and discuss change from lovenox to oral anticoagulation treatment. Patient is accompanied today by her daughter Zigmund Daniel. Patient has a past history of colon cancer. She has a history of chronic diarrhea. She has recently developed endometrial cancer or and at this time does not want to have surgery for this. Subsequently she developed a DVT and has been placed on Lovenox. This has become very expensive and she will be switched to Coumadin and will be followed by the pharmacist for her INRs. She has been taking Lasix 20 alternating with one half by mouth every other day. She has seen a oncology specialist in [facility] and they had indicated to the patient and her daughter that intrauterine radiation is only good to stop bleeding and not to stop the progression of the cancer. As of today the patient is still unwilling to have the hysterectomy, she indicates that she will consider it as a possible option. I reviewed everything with her and she seems to understand the complications of having a hysterectomy and not having a hysterectomy. After this long discussion it was decided however to talk to her oncologic gynecologist for further information regarding possible surgery and a hysterectomy.     Patient Active Problem List   Diagnosis Date Noted  . DVT (deep venous thrombosis) 10/04/2013  . HTN (hypertension) 10/04/2013  . Colon cancer   . Endometrial cancer 08/20/2013  . Chronic diarrhea, secondary to short bowel syndrome 06/27/2013  . Vitamin D deficiency 06/27/2013  . Atrial fibrillation, history of 12/22/2010   Outpatient Encounter Prescriptions as of 10/22/2013  Medication Sig  . bifidobacterium infantis (ALIGN) capsule Take 1 capsule by mouth daily.  Marland Kitchen BIOTIN PO Take 1 tablet by mouth daily.  . Calcium Carb-Cholecalciferol  (CALCIUM 500 +D) 500-400 MG-UNIT TABS Take 1 tablet by mouth 2 (two) times daily.  . cholecalciferol (VITAMIN D) 1000 UNITS tablet Take 1,000 Units by mouth daily.  . cyanocobalamin (,VITAMIN B-12,) 1000 MCG/ML injection Inject 1,000 mcg into the muscle every 30 (thirty) days.  Marland Kitchen enoxaparin (LOVENOX) 60 MG/0.6ML injection Inject 0.55 mLs (55 mg total) into the skin every 12 (twelve) hours.  . ferrous sulfate 325 (65 FE) MG tablet Take 1 tablet (325 mg total) by mouth daily with breakfast.  . furosemide (LASIX) 20 MG tablet Take 1 tablet (20 mg total) by mouth daily. As directed  . GLUCOSAMINE PO Take 1 tablet by mouth daily.  . isosorbide mononitrate (IMDUR) 30 MG 24 hr tablet Take 1 tablet (30 mg total) by mouth daily.  Marland Kitchen loperamide (IMODIUM A-D) 2 MG tablet Take 6 mg by mouth 4 (four) times daily. "Short bowel" syndrome.  . medium chain triglycerides (MCT OIL) oil Take 15 mLs by mouth 3 (three) times daily. Mix in glass with liquid.   For diarrhea and digestion help.  . metoprolol succinate (TOPROL-XL) 12.5 mg TB24 24 hr tablet Take 0.5 tablets (12.5 mg total) by mouth daily.  . Multiple Vitamin (MULTIVITAMIN WITH MINERALS) TABS Take 1 tablet by mouth daily.  . pantoprazole (PROTONIX) 40 MG tablet Take 1 tablet (40 mg total) by mouth daily.  . potassium chloride (K-DUR) 10 MEQ tablet Take 1 tablet (10 mEq total) by mouth daily. As directed  . solifenacin (VESICARE) 10 MG tablet Take 10 mg by mouth at bedtime.  Review of Systems  Constitutional: Positive for fever and fatigue (getting worse over time). Negative for chills.  HENT: Negative.   Eyes: Negative.   Respiratory: Negative.  Negative for shortness of breath.   Cardiovascular: Positive for leg swelling.  Gastrointestinal: Negative.   Endocrine: Negative.   Genitourinary: Negative.   Musculoskeletal: Negative.   Skin: Negative.   Allergic/Immunologic: Negative.   Neurological: Negative.   Hematological: Negative.     Psychiatric/Behavioral: Negative.        Objective:   Physical Exam  Nursing note and vitals reviewed. Constitutional: She is oriented to person, place, and time.  The patient appears weaker and somewhat pale and more depressed than on recent visits.  HENT:  Head: Normocephalic and atraumatic.  Eyes: Conjunctivae and EOM are normal. Pupils are equal, round, and reactive to light. Right eye exhibits no discharge. Left eye exhibits no discharge. No scleral icterus.  Neck: Normal range of motion.  Cardiovascular: Normal rate, regular rhythm and normal heart sounds.  Exam reveals no gallop and no friction rub.   No murmur heard. Slightly irregular at 72 per minute  Pulmonary/Chest: Effort normal and breath sounds normal. No respiratory distress. She has no wheezes. She has no rales. She exhibits no tenderness.  Abdominal: Soft. Bowel sounds are normal. She exhibits distension. She exhibits no mass. There is no tenderness. There is no guarding.  Musculoskeletal: Normal range of motion. She exhibits edema and tenderness.  3+ bilateral pretibial edema  Neurological: She is alert and oriented to person, place, and time. No cranial nerve deficit.  Skin: Skin is warm and dry. No rash noted.  Psychiatric: Judgment and thought content normal.  Depressed   BP 117/67  Pulse 81  Temp(Src) 99.8 F (37.7 C) (Oral)  Ht 5\' 4"  (1.626 m)  Wt 132 lb (59.875 kg)  BMI 22.65 kg/m2  [facility] reading (PRIMARY) by  Dr. Brunilda Payor x-ray-chronic changes no active disease                                       Assessment & Plan:  1. Atrial fibrillation, history of - POCT INR; Standing - POCT INR  2. Colon cancer  3. HTN (hypertension) - DG Chest 2 View; Future  4. Endometrial cancer  5. Vitamin D deficiency  6. Edema - DG Chest 2 View; Future -40 mg of Lasix today only and then do 20 mg daily -Daily weights  7. DVT (deep venous thrombosis) - POCT INR; Standing - POCT INR  Patient  Instructions  Continue current medications. Continue good therapeutic lifestyle changes which include good diet and exercise. Fall precautions discussed with patient. Schedule your flu vaccine if you haven't had it yet If you are over 59 years old - you may need Prevnar 6 or the adult Pneumonia vaccine. Take Lasix 40 mg today only. After today,do. 20 mg daily. Get her weight at the same time every day We will try to have a conversation with the oncologic gynecologist Re: the risk of hysterectomy and will get back with the daughter after this conversation. Start Coumadin 5 mg and take 1 by mouth daily Continue the Lovenox until the next protime is checked in 3 days If there are any problems with bleeding still call us immediately    Arrie Senate MD

## 2013-10-22 NOTE — Patient Instructions (Addendum)
Continue current medications. Continue good therapeutic lifestyle changes which include good diet and exercise. Fall precautions discussed with patient. Schedule your flu vaccine if you haven't had it yet If you are over 78 years old - you may need Prevnar 27 or the adult Pneumonia vaccine. Take Lasix 40 mg today only. After today,do. 20 mg daily. Get her weight at the same time every day We will try to have a conversation with the oncologic gynecologist Re: the risk of hysterectomy and will get back with the daughter after this conversation. Start Coumadin 5 mg and take 1 by mouth daily Continue the Lovenox until the next protime is checked in 3 days If there are any problems with bleeding still call us immediately

## 2013-10-25 ENCOUNTER — Ambulatory Visit (INDEPENDENT_AMBULATORY_CARE_PROVIDER_SITE_OTHER): Payer: Medicare Other | Admitting: Pharmacist

## 2013-10-25 ENCOUNTER — Other Ambulatory Visit: Payer: Self-pay | Admitting: *Deleted

## 2013-10-25 DIAGNOSIS — R195 Other fecal abnormalities: Secondary | ICD-10-CM

## 2013-10-25 DIAGNOSIS — I4891 Unspecified atrial fibrillation: Secondary | ICD-10-CM

## 2013-10-25 DIAGNOSIS — I82409 Acute embolism and thrombosis of unspecified deep veins of unspecified lower extremity: Secondary | ICD-10-CM

## 2013-10-25 DIAGNOSIS — I10 Essential (primary) hypertension: Secondary | ICD-10-CM

## 2013-10-25 LAB — BASIC METABOLIC PANEL WITH GFR
BUN: 19 mg/dL (ref 6–23)
CALCIUM: 9.5 mg/dL (ref 8.4–10.5)
CO2: 28 mEq/L (ref 19–32)
Chloride: 99 mEq/L (ref 96–112)
Creat: 0.86 mg/dL (ref 0.50–1.10)
GFR, EST AFRICAN AMERICAN: 70 mL/min
GFR, Est Non African American: 61 mL/min
Glucose, Bld: 104 mg/dL — ABNORMAL HIGH (ref 70–99)
Potassium: 4.5 mEq/L (ref 3.5–5.3)
Sodium: 136 mEq/L (ref 135–145)

## 2013-10-25 LAB — POCT INR: INR: 3.1

## 2013-10-25 MED ORDER — MEMANTINE HCL ER 21 MG PO CP24: 1.0000 | ORAL_CAPSULE | Freq: Every day | ORAL | Status: DC

## 2013-10-25 NOTE — Addendum Note (Signed)
Addended by: Pollyann Kennedy F on: 10/25/2013 02:51 PM   Modules accepted: Orders

## 2013-10-25 NOTE — Progress Notes (Signed)
Insurance wouldn't cover Namenda 10mg  BID but will cover Namenda XR 21mg  qd. Discussed with Dr. Laurance Flatten and changed order.

## 2013-10-25 NOTE — Patient Instructions (Signed)
Anticoagulation Dose Instructions as of 10/25/2013     Dorene Grebe Tue Wed Thu Fri Sat   New Dose 2.5 mg 2.5 mg 2.5 mg 2.5 mg 2.5 mg 2.5 mg 2.5 mg    Description       Stop lovenox / enoxaparin.  Decrease warfarin to 1/2 tablet daily      INR was 3.1 today

## 2013-10-26 LAB — CBC WITH DIFFERENTIAL
BASOS ABS: 0 10*3/uL (ref 0.0–0.2)
Basos: 0 %
EOS ABS: 0 10*3/uL (ref 0.0–0.4)
Eos: 0 %
HCT: 36.4 % (ref 34.0–46.6)
Hemoglobin: 11.7 g/dL (ref 11.1–15.9)
IMMATURE GRANS (ABS): 0 10*3/uL (ref 0.0–0.1)
Immature Granulocytes: 0 %
Lymphocytes Absolute: 0.6 10*3/uL — ABNORMAL LOW (ref 0.7–3.1)
Lymphs: 8 %
MCH: 27.9 pg (ref 26.6–33.0)
MCHC: 32.1 g/dL (ref 31.5–35.7)
MCV: 87 fL (ref 79–97)
Monocytes Absolute: 0.5 10*3/uL (ref 0.1–0.9)
Monocytes: 6 %
NEUTROS ABS: 6.7 10*3/uL (ref 1.4–7.0)
Neutrophils Relative %: 86 %
PLATELETS: 478 10*3/uL — AB (ref 150–379)
RBC: 4.19 x10E6/uL (ref 3.77–5.28)
RDW: 14 % (ref 12.3–15.4)
WBC: 7.9 10*3/uL (ref 3.4–10.8)

## 2013-10-26 NOTE — Progress Notes (Signed)
Patient is home bound due to complications associated with endometrial cancer including weakness and difficulty with ambulation. She requires home health services for physical therapy, occupational therapy, and to monitor edema and INR.

## 2013-10-26 NOTE — Progress Notes (Signed)
Referral to [facility] for PT, SN, social worker and Aide. Daughter aware.

## 2013-10-29 ENCOUNTER — Telehealth: Payer: Self-pay | Admitting: Pharmacist

## 2013-10-29 NOTE — Telephone Encounter (Signed)
Per Dr Laurance Flatten - CBC ok.  Continue to monitor regularly. Pt's daughter notified

## 2013-10-30 ENCOUNTER — Emergency Department (HOSPITAL_COMMUNITY): Payer: Medicare Other

## 2013-10-30 ENCOUNTER — Encounter (HOSPITAL_COMMUNITY): Payer: Self-pay | Admitting: Emergency Medicine

## 2013-10-30 ENCOUNTER — Emergency Department (HOSPITAL_COMMUNITY)
Admission: EM | Admit: 2013-10-30 | Discharge: 2013-10-30 | Disposition: A | Discharge status: Home / Self Care | Payer: Medicare Other | Attending: Emergency Medicine | Admitting: Emergency Medicine

## 2013-10-30 ENCOUNTER — Encounter: Payer: Self-pay | Admitting: *Deleted

## 2013-10-30 DIAGNOSIS — C7982 Secondary malignant neoplasm of genital organs: Secondary | ICD-10-CM | POA: Insufficient documentation

## 2013-10-30 DIAGNOSIS — R791 Abnormal coagulation profile: Secondary | ICD-10-CM | POA: Insufficient documentation

## 2013-10-30 DIAGNOSIS — Z7901 Long term (current) use of anticoagulants: Secondary | ICD-10-CM | POA: Insufficient documentation

## 2013-10-30 DIAGNOSIS — R531 Weakness: Secondary | ICD-10-CM

## 2013-10-30 DIAGNOSIS — I10 Essential (primary) hypertension: Secondary | ICD-10-CM | POA: Insufficient documentation

## 2013-10-30 DIAGNOSIS — I4891 Unspecified atrial fibrillation: Secondary | ICD-10-CM | POA: Insufficient documentation

## 2013-10-30 DIAGNOSIS — Z85038 Personal history of other malignant neoplasm of large intestine: Secondary | ICD-10-CM | POA: Insufficient documentation

## 2013-10-30 DIAGNOSIS — Z86718 Personal history of other venous thrombosis and embolism: Secondary | ICD-10-CM | POA: Insufficient documentation

## 2013-10-30 DIAGNOSIS — R5383 Other fatigue: Principal | ICD-10-CM

## 2013-10-30 DIAGNOSIS — R5381 Other malaise: Secondary | ICD-10-CM | POA: Insufficient documentation

## 2013-10-30 DIAGNOSIS — Z79899 Other long term (current) drug therapy: Secondary | ICD-10-CM | POA: Insufficient documentation

## 2013-10-30 DIAGNOSIS — R609 Edema, unspecified: Secondary | ICD-10-CM | POA: Insufficient documentation

## 2013-10-30 HISTORY — DX: Unspecified atrial fibrillation: I48.91

## 2013-10-30 LAB — CBC WITH DIFFERENTIAL/PLATELET
Basophils Absolute: 0 10*3/uL (ref 0.0–0.1)
Basophils Relative: 0 % (ref 0–1)
Eosinophils Absolute: 0 10*3/uL (ref 0.0–0.7)
Eosinophils Relative: 0 % (ref 0–5)
HCT: 35 % — ABNORMAL LOW (ref 36.0–46.0)
Hemoglobin: 11.7 g/dL — ABNORMAL LOW (ref 12.0–15.0)
Lymphocytes Relative: 7 % — ABNORMAL LOW (ref 12–46)
Lymphs Abs: 0.5 10*3/uL — ABNORMAL LOW (ref 0.7–4.0)
MCH: 28.7 pg (ref 26.0–34.0)
MCHC: 33.4 g/dL (ref 30.0–36.0)
MCV: 85.8 fL (ref 78.0–100.0)
MONO ABS: 0.6 10*3/uL (ref 0.1–1.0)
MONOS PCT: 7 % (ref 3–12)
NEUTROS ABS: 6.6 10*3/uL (ref 1.7–7.7)
NEUTROS PCT: 85 % — AB (ref 43–77)
Platelets: 463 10*3/uL — ABNORMAL HIGH (ref 150–400)
RBC: 4.08 MIL/uL (ref 3.87–5.11)
RDW: 14.4 % (ref 11.5–15.5)
WBC: 7.8 10*3/uL (ref 4.0–10.5)

## 2013-10-30 LAB — URINE MICROSCOPIC-ADD ON

## 2013-10-30 LAB — BASIC METABOLIC PANEL
BUN: 17 mg/dL (ref 6–23)
CHLORIDE: 94 meq/L — AB (ref 96–112)
CO2: 29 mEq/L (ref 19–32)
Calcium: 9.4 mg/dL (ref 8.4–10.5)
Creatinine, Ser: 0.69 mg/dL (ref 0.50–1.10)
GFR calc Af Amer: 88 mL/min — ABNORMAL LOW (ref >90)
GFR calc non Af Amer: 76 mL/min — ABNORMAL LOW (ref >90)
GLUCOSE: 105 mg/dL — AB (ref 70–99)
POTASSIUM: 3.9 meq/L (ref 3.7–5.3)
Sodium: 132 mEq/L — ABNORMAL LOW (ref 137–147)

## 2013-10-30 LAB — TROPONIN I: Troponin I: <0.3 ng/mL (ref <0.30)

## 2013-10-30 LAB — URINALYSIS, ROUTINE W REFLEX MICROSCOPIC
Bilirubin Urine: NEGATIVE
GLUCOSE, UA: NEGATIVE mg/dL
Ketones, ur: NEGATIVE mg/dL
Leukocytes, UA: NEGATIVE
Nitrite: NEGATIVE
Protein, ur: NEGATIVE mg/dL
SPECIFIC GRAVITY, URINE: 1.013 (ref 1.005–1.030)
UROBILINOGEN UA: 0.2 mg/dL (ref 0.0–1.0)
pH: 5.5 (ref 5.0–8.0)

## 2013-10-30 LAB — PROTIME-INR
INR: 5.09 (ref 0.00–1.49)
Prothrombin Time: 45 seconds — ABNORMAL HIGH (ref 11.6–15.2)

## 2013-10-30 LAB — PRO B NATRIURETIC PEPTIDE: Pro B Natriuretic peptide (BNP): 716.9 pg/mL — ABNORMAL HIGH (ref 0–450)

## 2013-10-30 MED ORDER — SODIUM CHLORIDE 0.9 % IV SOLN
INTRAVENOUS | Status: DC
  Administered 2013-10-30: 16:00:00 via INTRAVENOUS

## 2013-10-30 NOTE — Progress Notes (Signed)
Hold Coumadin watch for bleeding recheck protime tomorrow before lunch

## 2013-10-30 NOTE — ED Provider Notes (Signed)
TIME SEEN: 3:09 PM  CHIEF COMPLAINT: Generalized weakness  HPI: Patient is a 80 year old female with a history of atrial fibrillation, prior history of colon cancer, current endometrial cancer who is now receiving treatment, hypertension, recent diagnosis of right lower extremity DVT currently on Coumadin who presents the emergency department with complaints of several days of generalized weakness. Patient lives at home alone. She normally uses a cane to ambulate but her daughter reports that over the past week hasn't had to use a walker due to her weakness. She was recently started on Lasix for bilateral lower extremity edema. She was seen by her home health nurse today and had an INR checked and it was 5.3. Daughter reports they came to the emergency department with concerns for her generalized weakness and her elevated INR. Patient denies that she's had any fevers, chills, headache, neck pain or neck stiffness, chest pain or shortness of breath, cough, vomiting or diarrhea, vaginal bleeding, bloody stools or melena, dysuria or hematuria, focal weakness, numbness or tingling, recent head injury.  ROS: See HPI Constitutional: no fever  Eyes: no drainage  ENT: no runny nose   Cardiovascular:  no chest pain  Resp: no SOB  GI: no vomiting GU: no dysuria Integumentary: no rash  Allergy: no hives  Musculoskeletal:  leg swelling  Neurological: no slurred speech ROS otherwise negative  PAST MEDICAL HISTORY/PAST SURGICAL HISTORY:  Past Medical History  Diagnosis Date  . Cancer   . Arrhythmia   . Hypertension   . Colon cancer   . Endometrial cancer   . Atrial fibrillation     MEDICATIONS:  Prior to Admission medications   Medication Sig Start Date End Date Taking? Authorizing Provider  bifidobacterium infantis (ALIGN) capsule Take 1 capsule by mouth daily.    Historical Provider, MD  BIOTIN PO Take 1 tablet by mouth daily.    Historical Provider, MD  Calcium Carb-Cholecalciferol (CALCIUM  500 +D) 500-400 MG-UNIT TABS Take 1 tablet by mouth 2 (two) times daily.    Historical Provider, MD  cholecalciferol (VITAMIN D) 1000 UNITS tablet Take 1,000 Units by mouth daily.    Historical Provider, MD  cyanocobalamin (,VITAMIN B-12,) 1000 MCG/ML injection Inject 1,000 mcg into the muscle every 30 (thirty) days.    Historical Provider, MD  enoxaparin (LOVENOX) 60 MG/0.6ML injection Inject 0.55 mLs (55 mg total) into the skin every 12 (twelve) hours. 10/06/13   Kelvin Cellar, MD  ferrous sulfate 325 (65 FE) MG tablet Take 1 tablet (325 mg total) by mouth daily with breakfast. 06/27/13   Chipper Herb, MD  furosemide (LASIX) 20 MG tablet Take 1 tablet (20 mg total) by mouth daily. As directed 10/03/13   Chipper Herb, MD  GLUCOSAMINE PO Take 1 tablet by mouth daily.    Historical Provider, MD  isosorbide mononitrate (IMDUR) 30 MG 24 hr tablet Take 1 tablet (30 mg total) by mouth daily. 06/27/13   Chipper Herb, MD  loperamide (IMODIUM A-D) 2 MG tablet Take 6 mg by mouth 4 (four) times daily. "Short bowel" syndrome. 12/22/10   Thayer Headings, MD  medium chain triglycerides (MCT OIL) oil Take 15 mLs by mouth 3 (three) times daily. Mix in glass with liquid.   For diarrhea and digestion help.    Historical Provider, MD  Memantine HCl ER (NAMENDA XR) 21 MG CP24 Take 1 tablet by mouth daily. 10/25/13   Chipper Herb, MD  metoprolol succinate (TOPROL-XL) 12.5 mg TB24 24 hr tablet Take 0.5  tablets (12.5 mg total) by mouth daily. 06/27/13   Chipper Herb, MD  Multiple Vitamin (MULTIVITAMIN WITH MINERALS) TABS Take 1 tablet by mouth daily.    Historical Provider, MD  pantoprazole (PROTONIX) 40 MG tablet Take 1 tablet (40 mg total) by mouth daily. 06/27/13   Chipper Herb, MD  potassium chloride (K-DUR) 10 MEQ tablet Take 1 tablet (10 mEq total) by mouth daily. As directed 10/03/13   Chipper Herb, MD  solifenacin (VESICARE) 10 MG tablet Take 10 mg by mouth at bedtime.    Historical Provider, MD  warfarin  (COUMADIN) 5 MG tablet Take 1 tablet (5 mg total) by mouth daily. As directed 10/22/13   Chipper Herb, MD    ALLERGIES:  Allergies  Allergen Reactions  . Celebrex [Celecoxib]   . Meprobamate   . Rofecoxib     SOCIAL HISTORY:  History  Substance Use Topics  . Smoking status: Never Smoker   . Smokeless tobacco: Not on file  . Alcohol Use: No    FAMILY HISTORY: Family History  Problem Relation Age of Onset  . Heart disease Mother   . Heart disease Sister   . Heart disease Brother   . Colon cancer Sister     EXAM: BP 138/67  Pulse 91  Temp(Src) 99 F (37.2 C) (Oral)  Resp 16  SpO2 97% CONSTITUTIONAL: Alert and oriented and responds appropriately to questions. Well-appearing; well-nourished, elderly, no apparent distress HEAD: Normocephalic EYES: Conjunctivae clear, PERRL ENT: normal nose; no rhinorrhea; slightly dry mucous membranes; pharynx without lesions noted NECK: Supple, no meningismus, no LAD  CARD: RRR; S1 and S2 appreciated; no murmurs, no clicks, no rubs, no gallops RESP: Normal chest excursion without splinting or tachypnea; breath sounds clear and equal bilaterally; no wheezes, no rhonchi, no rales,  ABD/GI: Normal bowel sounds; non-distended; soft, non-tender, no rebound, no guarding BACK:  The back appears normal and is non-tender to palpation, there is no CVA tenderness EXT: Normal ROM in all joints; non-tender to palpation; 3+ bilateral pitting  edema to the mid calves; normal capillary refill; no cyanosis    SKIN: Normal color for age and race; warm NEURO: Moves all extremities equally, cranial nerves II through XII intact, sensation to light touch intact diffusely, strength 5/5 in all 4 extremities PSYCH: The patient's mood and manner are appropriate. Grooming and personal hygiene are appropriate.  MEDICAL DECISION MAKING: Patient here with generalized weakness for one week. She is neurologically intact on exam. She has no focal complaints.  Will obtain  generalized weakness workup with labs, urine, CXR.  Pt's oral temp was 99.  Will check rectal temp.  Pt also appears to be slightly dry except for peripheral edema.  Will give IVF.  ED PROGRESS: Patient is afebrile. Workup has been unremarkable. Patient is still asymptomatic without any current complaints. Chest x-ray clear. Urine shows a small amount of blood but no other sign of infection. Troponin pending. Will attempt to ambulate patient. Daughter reports that the patient is not able to ambulate, she does not believe she can take her home at this time.   6:53 PM  Pt's troponin is negative. She's been able to ambulate with a walker without difficulty. Her INR is elevated at 5.09. Have discussed with family the whole this medication for the next 2 days and then follow up with her primary care physician. Have given return precautions. Patient and daughter felt comfortable with discharge plan home.     EKG Interpretation  Date/Time:  Tuesday October 30 2013 15:26:42 EST Ventricular Rate:  93 PR Interval:  112 QRS Duration: 83 QT Interval:  346 QTC Calculation: 430 R Axis:   -34 Text Interpretation:  Sinus rhythm Multiple premature complexes, vent  Borderline short PR interval Left axis deviation Borderline low voltage, extremity leads Nonspecific T abnormalities, lateral leads Baseline wander in lead(s) I III aVL No significant change since last tracing Confirmed by Latrenda Irani  DO, Miu Chiong ([id number]) on 10/30/2013 3:32:02 PM             [place], DO 10/30/13 0626

## 2013-10-30 NOTE — ED Notes (Signed)
Bed: WA22 Expected date:  Expected time:  Means of arrival:  Comments: ems 

## 2013-10-30 NOTE — Telephone Encounter (Signed)
[name] [name] and she was at the house with [name] [name] and the Media planner.  Patient is pale and weak and she was unable to get a hemoglolbin on her- planning to send her to [facility] per nurse.  Patient should hold coumadin until it returns to less than 3.  She may require oral vitamin K depending on hemoglobin results. Etc.

## 2013-10-30 NOTE — Discharge Instructions (Signed)
Your INR is elevated at 5.09 today. We recommend that you hold your Coumadin tonight and tomorrow and then follow up with your doctor for further recommendations. Your labs, urine, chest x-ray today were normal. He did have a small amount of blood in your urine but no other sign of infection. We feel you're safe to be discharged recommend she followup with your primary care physician the next several days.    Warfarin Coagulopathy Warfarin (Coumadin) coagulopathy refers to bleeding that may occur as a complication of the medicine warfarin. Warfarin is an oral blood thinner (anticoagulant). Warfarin is used for medical conditions where thinning of the blood is needed to prevent blood clots.  CAUSES Bleeding is the most common and most serious complication of warfarin. The amount of bleeding is related to the warfarin dose and length of treatment. In addition, bleeding complications can also occur due to:  Intentional or accidental warfarin overdose.  Underlying medical conditions.  Dietary changes.  Medicine, herbal, supplement, or alcohol interactions. SYMPTOMS Severe bleeding while on warfarin may occur from any tissue or organ. Symptoms of the blood being too thin may include:  Bleeding from the nose or gums.  Blood in bowel movements which may appear as bright red, dark, or black tarry stools.  Blood in the urine which may appear as pink, red, or brown urine.  Unusual bruising or bruising easily.  A cut that does not stop bleeding within 10 minutes.  Vomiting blood or continuous nausea for more than 1 day.  Coughing up blood.  Broken blood vessels in your eye (subconjunctival hemorrhage).  Abdominal or back pain with or without flank bruising.  Sudden, severe headache.  Sudden weakness or numbness of the face, arm, or leg, especially on one side of the body.  Sudden confusion.  Trouble speaking (aphasia) or understanding.  Sudden trouble seeing in one or both  eyes.  Sudden trouble walking.  Dizziness.  Loss of balance or coordination.  Vaginal bleeding.  Swelling or pain at an injection site.  Superficial fat tissue death (necrosis) which may cause skin scarring. This is more common in women and may first present as pain in the waist, thighs, and buttocks.  Fever. HOME CARE INSTRUCTIONS  Always contact your caregiver of any concerns or signs of possible warfarin coagulopathy as soon as possible.  Take warfarin exactly as directed by your caregiver. It is recommended that you take your warfarin dose at the same time of the day. It is preferred that you take warfarin in the late afternoon. If you have been told to stop taking warfarin, do not resume taking warfarin until directed to do so by your caregiver. Follow your caregiver's instructions if you accidentally take an extra dose or miss a dose of warfarin. It is very important to take warfarin as directed since bleeding or blood clots could result in chronic or permanent injury, pain, or disability.  Keep all follow-up appointments with your caregiver as directed. It is very important to keep your appointments. Not keeping appointments could result in a chronic or permanent injury, pain, or disability because warfarin is a medicine that requires close monitoring.  While taking warfarin, you will need to have regular blood tests to measure your blood clotting time. These blood tests usually include both the prothrombin time (PT) and International Normalized Ratio (INR) tests. The PT and INR results allow your caregiver to adjust your dose of warfarin. The dose can change for many reasons. It is critically important that you have  your PT and INR levels drawn exactly as directed. PT and INR lab draws are usually done in the morning. Your warfarin dose may stay the same or change depending on what the PT and INR results are. Be sure to follow up with your caregiver regarding your PT and INR test  results and what your warfarin dosage should be.  Many medicines can interfere with warfarin and affect the PT and INR results. You must tell your caregiver about any and all medicines you take, this includes all vitamins and supplements. Ask your caregiver before taking these. Prescription and over-the-counter medicine consistency is critical to warfarin management. It is important that potential interactions are checked before you start a new medicine. Be especially cautious with aspirin and anti-inflammatory medicines. Ask your caregiver before taking these. Medicines such as antibiotics and acid-reducing medicine can interact with warfarin and can cause an increased warfarin effect. Warfarin can also interfere with the effectiveness of medicines you are taking. Do not take or discontinue any prescribed or over-the-counter medicine except on the advice of your caregiver or pharmacist.  Some vitamins, supplements, and herbal products interfere with the effectiveness of warfarin. Vitamin E may increase the anticoagulant effects of warfarin. Vitamin K may can cause warfarin to be less effective. Do not take or discontinue any vitamin, supplement, or herbal product except on the advice of your caregiver or pharmacist.  Some foods, especially foods high in vitamin K can interfere with the effectiveness of warfarin and affect the PT and INR results. A diet too high in vitamin K can cause warfarin to be less effective. A diet too low in foods containing vitamin K may lead to an excessive warfarin effect. Foods high in vitamin K include spinach, kale, broccoli, cabbage, collard and turnip greens, brussels sprouts, peas, cauliflower, seaweed, and parsley as well as beef and pork liver, green tea, and soybean oil. Eat what you normally eat and keep the vitamin K content of your diet consistent. Avoid major changes in your diet, or notify your caregiver before changing your diet. Arrange a visit with a dietitian to  answer your questions.  If you have a loss of appetite or get the stomach flu (viral gastroenteritis), talk to your caregiver as soon as possible. A decrease in your normal vitamin K intake can make you more sensitive to your usual dose of warfarin.  Some medical conditions may increase your risk for bleeding while you are taking warfarin. A fever, diarrhea lasting more than a day, worsening heart failure, or worsening liver function are some medical conditions that could affect warfarin. Contact your caregiver if you have any of these medical conditions.  Be careful not to cut yourself when using sharp objects or while shaving.  Alcohol can change the body's ability to handle warfarin. It is best to avoid alcoholic drinks or consume only very small amounts while taking warfarin. Notify your caregiver if you change your alcohol intake. A sudden increase in alcohol use can increase your risk of bleeding. Chronic alcohol use can cause warfarin to be less effective.  Limit physical activities or sports that could result in a fall or cause injury.  Do not use warfarin if you are pregnant.  Inform all your caregivers and your dentist that you take warfarin.  Inform all caregivers if you are taking warfarin and aspirin or platelet inhibitor medicines such as clopidogrel, ticagrelor, or prasugrel. Use of these medicines in conjunction with warfarin can increase your risk of bleeding or death. Taking  these medicines together should only be done under the direct care of your caregiver. SEEK IMMEDIATE MEDICAL CARE IF:  You cough up blood.  You have dark or black stools or there is bright red blood coming from your rectum.  You vomit blood or have nausea for more than 1 day.  You have blood in the urine or pink colored urine.  You have unusual bruising or have increased bruising.  You have bleeding from the nose or gums that does not stop quickly.  You have a cut that does not stop bleeding  within a 2 3 minutes.  You have sudden weakness or numbness of the face, arm, or leg, especially on one side of the body.  You have sudden confusion.  You have trouble speaking (aphasia) or understanding.  You have sudden trouble seeing in one or both eyes.  You have sudden trouble walking.  You have dizziness.  You have a loss of balance or coordination.  You have a sudden, severe headache.  You have a serious fall or head injury, even if you are not bleeding.  You have swelling or pain at an injection site.  You have unexplained tenderness or pain in the abdomen, back, waist, thighs or buttocks.  You have a fever. Any of these symptoms may represent a serious problem that is an emergency. Do not wait to see if the symptoms will go away. Get medical help right away. Call your local emergency services (911 in [place] Do not drive yourself to the hospital. Document Released: 08/15/2006 Document Revised: 03/07/2012 Document Reviewed: 02/15/2012 [facility] Patient Information 2014 [place].  Weakness Weakness is a lack of strength. You may feel weak all over your body or just in one part of your body. Weakness can be serious. In some cases, you may need more medical tests. HOME [place] a well-balanced diet.  Try to exercise every day.  Only take medicines as told by your doctor. GET HELP RIGHT AWAY IF:   You cannot do your normal daily activities.  You cannot walk up and down stairs, or you feel very tired when you do so.  You have shortness of breath or chest pain.  You have trouble moving parts of your body.  You have weakness in only one body part or on only one side of the body.  You have a fever.  You have trouble speaking or swallowing.  You cannot control when you pee (urinate) or poop (bowel movement).  You have black or bloody throw up (vomit) or poop.  Your weakness gets worse or spreads to other body parts.  You have new aches or  pains. MAKE SURE YOU:   Understand these instructions.  Will watch your condition.  Will get help right away if you are not doing well or get worse. Document Released: 08/19/2008 Document Revised: 03/07/2012 Document Reviewed: 11/05/2011 [facility] Patient Information 2014 [place].

## 2013-10-30 NOTE — Progress Notes (Signed)
NOTE - pt was sent to [facility] after this discussion today Pt daughter is aware to contact us when she gets home for further advice.

## 2013-10-30 NOTE — ED Notes (Signed)
Patient ambulated 30 feet with the assistance of a walker. 

## 2013-10-30 NOTE — ED Notes (Signed)
Pt presents in NAD. Per [facility]- pt resides at home.FULL CODE. Per family pt c/o of weakness and lower extremely edema. Seen at PCP today and resulted abnormal labs.  Requesting to be seen at ED for  Reevaluation.Denies N/V/D and fever. GCS 15. NEG for STROKE.

## 2013-10-30 NOTE — ED Notes (Signed)
Pt states her legs are swollen and hurt when someone is pressing on them, denies pain when laying in bed, states unsure of how long legs have been swollen, pt states she felt weak this morning, denies n/v/d.

## 2013-10-30 NOTE — Progress Notes (Signed)
Sharyn Lull can you advise this protime tammy was switching pt from lovenox to warfarin and last lovenox was last Thursday [facility] checked protime today at 1230 and it was 5.3. Can you call daughter Ulis Rias with next/change in direction Right now she is on 2.5 daily. Ph# [phone]    Dr Laurance Flatten - please address this today- Sharyn Lull did not get to it before she left

## 2013-10-31 ENCOUNTER — Telehealth: Payer: Self-pay | Admitting: Family Medicine

## 2013-10-31 ENCOUNTER — Other Ambulatory Visit: Payer: Self-pay | Admitting: Pharmacist

## 2013-10-31 LAB — URINE CULTURE
COLONY COUNT: NO GROWTH
CULTURE: NO GROWTH

## 2013-10-31 MED ORDER — WARFARIN SODIUM 1 MG PO TABS: 1.0000 mg | ORAL_TABLET | Freq: Every day | ORAL | Status: AC

## 2013-10-31 NOTE — Telephone Encounter (Signed)
Per Dr Laurance Flatten and Lynelle Smoke, have [facility] recheck protime on 11/01/13. [facility] and pts daughter aware

## 2013-11-01 ENCOUNTER — Telehealth: Payer: Self-pay | Admitting: *Deleted

## 2013-11-01 ENCOUNTER — Ambulatory Visit (INDEPENDENT_AMBULATORY_CARE_PROVIDER_SITE_OTHER): Payer: Medicare Other | Admitting: Pharmacist

## 2013-11-01 DIAGNOSIS — I4891 Unspecified atrial fibrillation: Secondary | ICD-10-CM

## 2013-11-01 DIAGNOSIS — I82409 Acute embolism and thrombosis of unspecified deep veins of unspecified lower extremity: Secondary | ICD-10-CM

## 2013-11-01 LAB — POCT INR: INR: 3.2

## 2013-11-01 MED ORDER — CYANOCOBALAMIN 1000 MCG/ML IJ SOLN: 1000.0000 ug | INTRAMUSCULAR | Status: AC

## 2013-11-01 NOTE — Progress Notes (Signed)
Patient checks INR at home by home health nurse with [facility]

## 2013-11-01 NOTE — Telephone Encounter (Signed)
PT 38.4 INR 3.2

## 2013-11-01 NOTE — Telephone Encounter (Signed)
Please see anticoag note from today for dosing instructions.

## 2013-11-02 NOTE — Telephone Encounter (Signed)
I am not sure what is going on with this RX.  It looks like someone named Abigail [employer] entered it in under Dr Tawanna Sat name. There is a note that it was a mistake. Please follow up with Dr Laurance Flatten.

## 2013-11-02 NOTE — Telephone Encounter (Signed)
I did not prescribe this medication for this patient. Please let the daughter know that she is not to get this prescription filled or return it to the pharmacy

## 2013-11-02 NOTE — Telephone Encounter (Signed)
Patient and [facility] aware

## 2013-11-05 ENCOUNTER — Telehealth: Payer: Self-pay | Admitting: *Deleted

## 2013-11-05 ENCOUNTER — Ambulatory Visit: Payer: Medicare Other | Admitting: Family Medicine

## 2013-11-05 ENCOUNTER — Ambulatory Visit (INDEPENDENT_AMBULATORY_CARE_PROVIDER_SITE_OTHER): Payer: Medicare Other | Admitting: Pharmacist

## 2013-11-05 DIAGNOSIS — I82409 Acute embolism and thrombosis of unspecified deep veins of unspecified lower extremity: Secondary | ICD-10-CM

## 2013-11-05 DIAGNOSIS — I4891 Unspecified atrial fibrillation: Secondary | ICD-10-CM

## 2013-11-05 LAB — CULTURE, BLOOD (ROUTINE X 2)
CULTURE: NO GROWTH
Culture: NO GROWTH

## 2013-11-05 LAB — POCT INR: INR: 1.8

## 2013-11-05 NOTE — Progress Notes (Signed)
Patient has INR checked at home by home health nurse with [facility]

## 2013-11-05 NOTE — Telephone Encounter (Signed)
Increase warfarin dose to 2mg  on Mondays and Fridays and 1mg  all other days.   Ordered recheck of INR on THursday 11/08/13. Spoke with Leandra with [facility] with dose change and INR order.  Spoke with her daughter Ulis Rias with dosing changes.

## 2013-11-05 NOTE — Telephone Encounter (Signed)
PT 22.1, INR 1.8 , Taking 1mg  qd

## 2013-11-08 ENCOUNTER — Telehealth: Payer: Self-pay | Admitting: *Deleted

## 2013-11-08 ENCOUNTER — Ambulatory Visit (INDEPENDENT_AMBULATORY_CARE_PROVIDER_SITE_OTHER): Payer: Medicare Other | Admitting: Pharmacist

## 2013-11-08 DIAGNOSIS — I4891 Unspecified atrial fibrillation: Secondary | ICD-10-CM

## 2013-11-08 DIAGNOSIS — I82409 Acute embolism and thrombosis of unspecified deep veins of unspecified lower extremity: Secondary | ICD-10-CM

## 2013-11-08 LAB — POCT INR: INR: 2.5

## 2013-11-08 NOTE — Telephone Encounter (Signed)
INR addressed - therapeutic.  No change in warfarin dosing.  Recheck INR in 1 week.  Called patient's daughter and Delrae Rend with [facility].  Order for B12 injection sent to [facility] in home health.

## 2013-11-08 NOTE — Telephone Encounter (Signed)
Pt daughter called this morning for an update for [facility]. She states that pt is very weak and her bp is running a little lower than they would like. Dr Laurance Flatten advised that they could stop the metoprolol totally and monitor BP's and bring a list by in 1-2 weeks. She also states that her leg swelling is better - but now it seems to be more swelling in her abdomen. Dr Laurance Flatten thinks this could be from her sitting with feet elevated and it may be pooling more in the abdomen in than in her legs.

## 2013-11-08 NOTE — Progress Notes (Signed)
Patient checks INR at home by home health nurse with [facility]  No charge for labs or visit - INR check through home monitoring system

## 2013-11-08 NOTE — Telephone Encounter (Signed)
Alisa with [facility] called with results for INR 2.5 Current dose of Warfarin is 2mg  on Monday and Friday, 1mg  all other days Please call to advise Also needs order faxed over for Vit b12 injections to be given monthly Please include DX Fax to [phone] Attn: [place] [facility]

## 2013-11-12 NOTE — Telephone Encounter (Signed)
Order faxed over to [facility] to give b12 monthly signed by [facility]

## 2013-11-14 ENCOUNTER — Telehealth: Payer: Self-pay | Admitting: Pharmacist

## 2013-11-14 ENCOUNTER — Ambulatory Visit (INDEPENDENT_AMBULATORY_CARE_PROVIDER_SITE_OTHER): Payer: Medicare Other | Admitting: Pharmacist

## 2013-11-14 DIAGNOSIS — I82409 Acute embolism and thrombosis of unspecified deep veins of unspecified lower extremity: Secondary | ICD-10-CM

## 2013-11-14 DIAGNOSIS — I4891 Unspecified atrial fibrillation: Secondary | ICD-10-CM

## 2013-11-14 LAB — POCT INR: INR: 3.1

## 2013-11-14 NOTE — Telephone Encounter (Signed)
INR 3.1 today. Warfarin 2mg  Monday and Friday.  Spoke with Alise who was still at patient's home.  Hold warfarin today, then decrease dose to 2mg  on mondays and 1mg  all other days.  Recheck INR in 1 week.

## 2013-11-16 ENCOUNTER — Telehealth: Payer: Self-pay | Admitting: *Deleted

## 2013-11-16 NOTE — Telephone Encounter (Signed)
Hospice would like order for Roxanol from Dr. Laurance Flatten. Dr. Laurance Flatten is not here today, so Dr. Jacelyn Grip signed the Rx for me and it was faxed to [facility]

## 2013-11-17 DIAGNOSIS — I10 Essential (primary) hypertension: Secondary | ICD-10-CM

## 2013-11-17 DIAGNOSIS — C549 Malignant neoplasm of corpus uteri, unspecified: Secondary | ICD-10-CM

## 2013-11-17 DIAGNOSIS — Z86718 Personal history of other venous thrombosis and embolism: Secondary | ICD-10-CM

## 2013-11-17 DIAGNOSIS — Z85038 Personal history of other malignant neoplasm of large intestine: Secondary | ICD-10-CM

## 2013-11-20 ENCOUNTER — Other Ambulatory Visit: Payer: Self-pay

## 2013-12-19 DEATH — deceased
